# Patient Record
Sex: Female | Born: 1955 | Race: White | Hispanic: No | Marital: Married | Smoking: Current every day smoker
Health system: Southern US, Community
[De-identification: ages and names within clinical notes are randomized; demographics above are authoritative.]

## PROBLEM LIST (undated history)

## (undated) DIAGNOSIS — I714 Abdominal aortic aneurysm, without rupture, unspecified: Secondary | ICD-10-CM

## (undated) DIAGNOSIS — F329 Major depressive disorder, single episode, unspecified: Secondary | ICD-10-CM

## (undated) DIAGNOSIS — Z62819 Personal history of unspecified abuse in childhood: Secondary | ICD-10-CM

## (undated) DIAGNOSIS — I25118 Atherosclerotic heart disease of native coronary artery with other forms of angina pectoris: Secondary | ICD-10-CM

## (undated) DIAGNOSIS — I471 Supraventricular tachycardia, unspecified: Secondary | ICD-10-CM

## (undated) DIAGNOSIS — R4189 Other symptoms and signs involving cognitive functions and awareness: Secondary | ICD-10-CM

## (undated) DIAGNOSIS — T1490XA Injury, unspecified, initial encounter: Secondary | ICD-10-CM

## (undated) DIAGNOSIS — F410 Panic disorder [episodic paroxysmal anxiety] without agoraphobia: Secondary | ICD-10-CM

## (undated) HISTORY — PX: MASTECTOMY PARTIAL / LUMPECTOMY: SUR851

## (undated) HISTORY — DX: Supraventricular tachycardia: I47.1

## (undated) HISTORY — DX: Injury, unspecified, initial encounter: T14.90XA

## (undated) HISTORY — PX: ABLATION: SHX5711

## (undated) HISTORY — DX: Abdominal aortic aneurysm, without rupture, unspecified: I71.40

## (undated) HISTORY — PX: APPENDECTOMY: SHX54

## (undated) HISTORY — DX: Major depressive disorder, single episode, unspecified: F32.9

## (undated) HISTORY — DX: Other symptoms and signs involving cognitive functions and awareness: R41.89

## (undated) HISTORY — DX: Panic disorder (episodic paroxysmal anxiety): F41.0

## (undated) HISTORY — DX: Atherosclerotic heart disease of native coronary artery with other forms of angina pectoris: I25.118

## (undated) HISTORY — DX: Personal history of unspecified abuse in childhood: Z62.819

## (undated) HISTORY — DX: Abdominal aortic aneurysm, without rupture: I71.4

## (undated) HISTORY — PX: OTHER SURGICAL HISTORY: SHX169

## (undated) HISTORY — PX: TOTAL ABDOMINAL HYSTERECTOMY: SHX209

## (undated) HISTORY — DX: Supraventricular tachycardia, unspecified: I47.10

---

## 1998-05-28 ENCOUNTER — Ambulatory Visit (HOSPITAL_BASED_OUTPATIENT_CLINIC_OR_DEPARTMENT_OTHER): Admission: RE | Admit: 1998-05-28 | Discharge: 1998-05-28 | Payer: Self-pay | Admitting: Orthopedic Surgery

## 2001-05-28 ENCOUNTER — Encounter: Payer: Self-pay | Admitting: Obstetrics and Gynecology

## 2001-05-31 ENCOUNTER — Inpatient Hospital Stay (HOSPITAL_COMMUNITY): Admission: RE | Admit: 2001-05-31 | Discharge: 2001-06-02 | Payer: Self-pay | Admitting: Obstetrics and Gynecology

## 2001-05-31 ENCOUNTER — Encounter (INDEPENDENT_AMBULATORY_CARE_PROVIDER_SITE_OTHER): Payer: Self-pay | Admitting: Specialist

## 2003-08-21 ENCOUNTER — Inpatient Hospital Stay (HOSPITAL_COMMUNITY): Admission: EM | Admit: 2003-08-21 | Discharge: 2003-08-29 | Payer: Self-pay | Admitting: Emergency Medicine

## 2004-05-10 ENCOUNTER — Ambulatory Visit: Payer: Self-pay | Admitting: Cardiology

## 2004-09-05 ENCOUNTER — Ambulatory Visit (HOSPITAL_COMMUNITY): Admission: RE | Admit: 2004-09-05 | Discharge: 2004-09-05 | Payer: Self-pay | Admitting: Orthopedic Surgery

## 2004-09-15 ENCOUNTER — Ambulatory Visit (HOSPITAL_COMMUNITY): Admission: RE | Admit: 2004-09-15 | Discharge: 2004-09-16 | Payer: Self-pay | Admitting: Orthopedic Surgery

## 2004-10-03 ENCOUNTER — Ambulatory Visit: Payer: Self-pay | Admitting: Internal Medicine

## 2004-10-03 ENCOUNTER — Inpatient Hospital Stay (HOSPITAL_COMMUNITY): Admission: EM | Admit: 2004-10-03 | Discharge: 2004-10-03 | Payer: Self-pay | Admitting: Emergency Medicine

## 2004-10-06 ENCOUNTER — Ambulatory Visit: Payer: Self-pay | Admitting: Internal Medicine

## 2004-11-03 ENCOUNTER — Ambulatory Visit: Payer: Self-pay | Admitting: Cardiology

## 2004-11-10 ENCOUNTER — Ambulatory Visit: Payer: Self-pay | Admitting: Internal Medicine

## 2004-11-15 ENCOUNTER — Ambulatory Visit: Payer: Self-pay

## 2004-11-16 ENCOUNTER — Ambulatory Visit: Payer: Self-pay

## 2005-01-11 ENCOUNTER — Encounter: Payer: Self-pay | Admitting: Critical Care Medicine

## 2005-01-20 ENCOUNTER — Ambulatory Visit: Payer: Self-pay | Admitting: Cardiology

## 2005-05-09 ENCOUNTER — Ambulatory Visit: Payer: Self-pay | Admitting: Cardiology

## 2005-05-31 ENCOUNTER — Ambulatory Visit: Payer: Self-pay | Admitting: Critical Care Medicine

## 2005-07-13 ENCOUNTER — Ambulatory Visit: Payer: Self-pay | Admitting: Critical Care Medicine

## 2005-10-01 ENCOUNTER — Emergency Department (HOSPITAL_COMMUNITY): Admission: EM | Admit: 2005-10-01 | Discharge: 2005-10-01 | Payer: Self-pay | Admitting: Emergency Medicine

## 2005-10-09 ENCOUNTER — Ambulatory Visit: Payer: Self-pay | Admitting: Internal Medicine

## 2005-10-23 ENCOUNTER — Ambulatory Visit: Payer: Self-pay | Admitting: Internal Medicine

## 2005-10-25 ENCOUNTER — Ambulatory Visit: Payer: Self-pay | Admitting: Internal Medicine

## 2005-10-25 ENCOUNTER — Inpatient Hospital Stay (HOSPITAL_COMMUNITY): Admission: AD | Admit: 2005-10-25 | Discharge: 2005-10-25 | Payer: Self-pay | Admitting: Internal Medicine

## 2005-10-27 ENCOUNTER — Ambulatory Visit: Payer: Self-pay | Admitting: Internal Medicine

## 2005-10-27 ENCOUNTER — Observation Stay (HOSPITAL_COMMUNITY): Admission: AD | Admit: 2005-10-27 | Discharge: 2005-10-28 | Payer: Self-pay | Admitting: Internal Medicine

## 2005-12-05 ENCOUNTER — Ambulatory Visit: Payer: Self-pay | Admitting: Internal Medicine

## 2007-10-09 ENCOUNTER — Ambulatory Visit: Payer: Self-pay | Admitting: Cardiology

## 2007-10-09 ENCOUNTER — Ambulatory Visit: Payer: Self-pay

## 2007-10-18 DIAGNOSIS — G4733 Obstructive sleep apnea (adult) (pediatric): Secondary | ICD-10-CM | POA: Insufficient documentation

## 2007-10-18 DIAGNOSIS — I10 Essential (primary) hypertension: Secondary | ICD-10-CM

## 2007-10-18 DIAGNOSIS — G4730 Sleep apnea, unspecified: Secondary | ICD-10-CM | POA: Insufficient documentation

## 2007-10-18 HISTORY — DX: Essential (primary) hypertension: I10

## 2007-10-18 HISTORY — DX: Obstructive sleep apnea (adult) (pediatric): G47.33

## 2007-10-21 ENCOUNTER — Ambulatory Visit: Payer: Self-pay | Admitting: Critical Care Medicine

## 2007-10-21 ENCOUNTER — Ambulatory Visit: Payer: Self-pay | Admitting: Internal Medicine

## 2007-10-21 DIAGNOSIS — J984 Other disorders of lung: Secondary | ICD-10-CM | POA: Insufficient documentation

## 2007-10-21 LAB — CONVERTED CEMR LAB
Cholesterol: 195 mg/dL (ref 0–200)
HDL: 33.4 mg/dL — ABNORMAL LOW (ref >39.0)
LDL Cholesterol: 129 mg/dL — ABNORMAL HIGH (ref 0–99)
TSH: 1.39 microintl units/mL (ref 0.35–5.50)
Triglycerides: 165 mg/dL — ABNORMAL HIGH (ref 0–149)

## 2007-10-22 ENCOUNTER — Encounter: Payer: Self-pay | Admitting: Critical Care Medicine

## 2007-10-24 ENCOUNTER — Telehealth: Payer: Self-pay | Admitting: Critical Care Medicine

## 2007-11-14 ENCOUNTER — Ambulatory Visit: Payer: Self-pay | Admitting: Internal Medicine

## 2008-11-21 DIAGNOSIS — Z8679 Personal history of other diseases of the circulatory system: Secondary | ICD-10-CM | POA: Insufficient documentation

## 2008-11-21 DIAGNOSIS — E6609 Other obesity due to excess calories: Secondary | ICD-10-CM

## 2008-11-21 DIAGNOSIS — F17200 Nicotine dependence, unspecified, uncomplicated: Secondary | ICD-10-CM | POA: Insufficient documentation

## 2008-11-21 DIAGNOSIS — R011 Cardiac murmur, unspecified: Secondary | ICD-10-CM

## 2008-11-21 DIAGNOSIS — K219 Gastro-esophageal reflux disease without esophagitis: Secondary | ICD-10-CM

## 2008-11-21 DIAGNOSIS — E66811 Obesity, class 1: Secondary | ICD-10-CM

## 2008-11-21 DIAGNOSIS — R519 Headache, unspecified: Secondary | ICD-10-CM | POA: Insufficient documentation

## 2008-11-21 DIAGNOSIS — O009 Unspecified ectopic pregnancy without intrauterine pregnancy: Secondary | ICD-10-CM | POA: Insufficient documentation

## 2008-11-21 DIAGNOSIS — R51 Headache: Secondary | ICD-10-CM | POA: Insufficient documentation

## 2008-11-21 DIAGNOSIS — I2510 Atherosclerotic heart disease of native coronary artery without angina pectoris: Secondary | ICD-10-CM | POA: Insufficient documentation

## 2008-11-21 DIAGNOSIS — I00 Rheumatic fever without heart involvement: Secondary | ICD-10-CM | POA: Insufficient documentation

## 2008-11-21 DIAGNOSIS — I498 Other specified cardiac arrhythmias: Secondary | ICD-10-CM | POA: Insufficient documentation

## 2008-11-21 DIAGNOSIS — E669 Obesity, unspecified: Secondary | ICD-10-CM

## 2008-11-21 DIAGNOSIS — M542 Cervicalgia: Secondary | ICD-10-CM

## 2008-11-21 HISTORY — DX: Gastro-esophageal reflux disease without esophagitis: K21.9

## 2008-11-21 HISTORY — DX: Obesity, class 1: E66.811

## 2008-11-21 HISTORY — DX: Other obesity due to excess calories: E66.09

## 2008-11-21 HISTORY — DX: Cardiac murmur, unspecified: R01.1

## 2008-11-21 HISTORY — DX: Personal history of other diseases of the circulatory system: Z86.79

## 2009-04-29 IMAGING — CT CT CHEST W/O CM
1 of 4 series · 9 of 31 positions shown, 12 images · non-contrast
Comparison: NONE

CLINICAL DATA: Evaluate for pulmonary nodules. 

CT CHEST WITHOUT INTRAVENOUS CONTRAST
TECHNIQUE: Multiple axial images were obtained from the lung 
apex through the upper abdomen.

[Series 2: chest wo · axial · 0.77mm/px · z∈[+887,+1197]mm · 9 of 80 slices shown, 12 images]
[im 9/80  mediastinal]
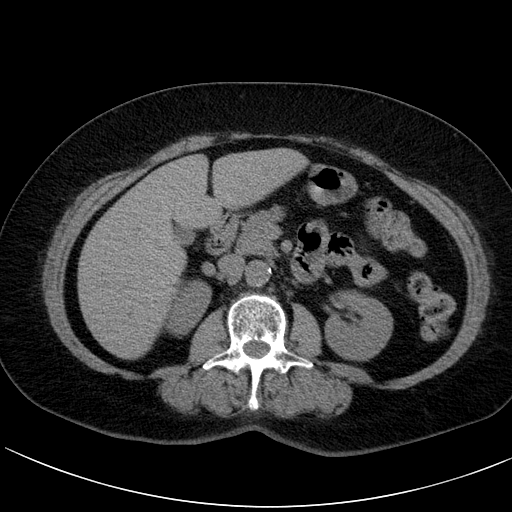
[im 9/80  lung]
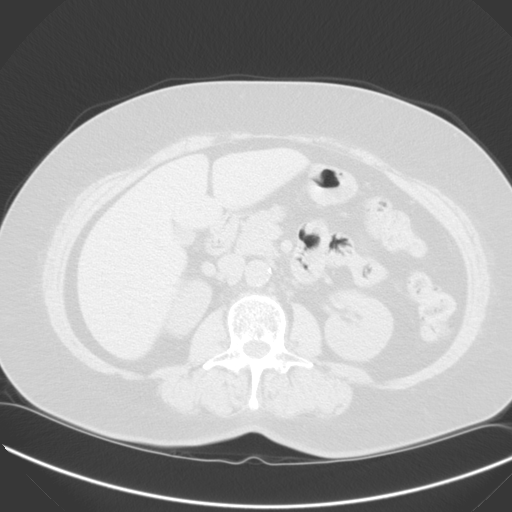
[im 18/80  lung]
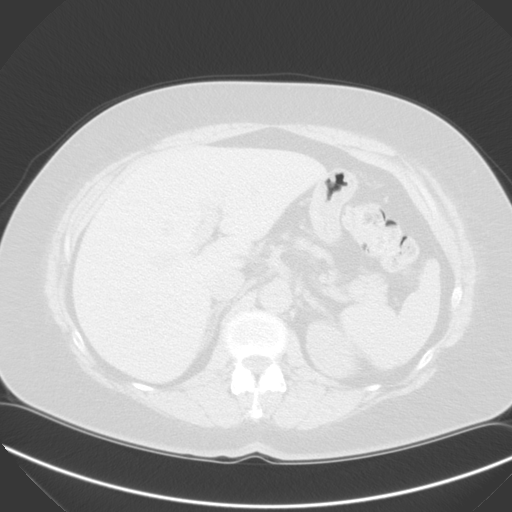
[im 27/80  lung]
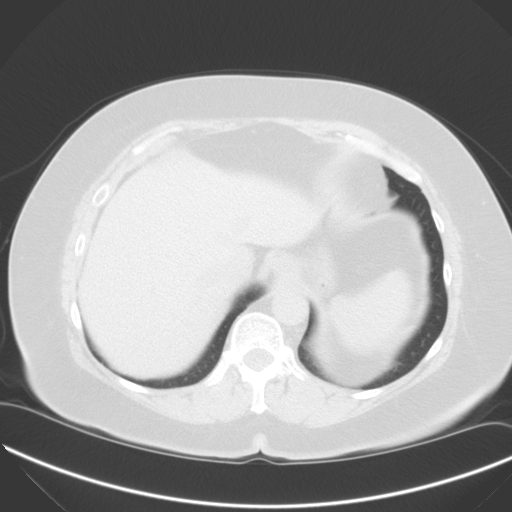
[im 36/80  lung]
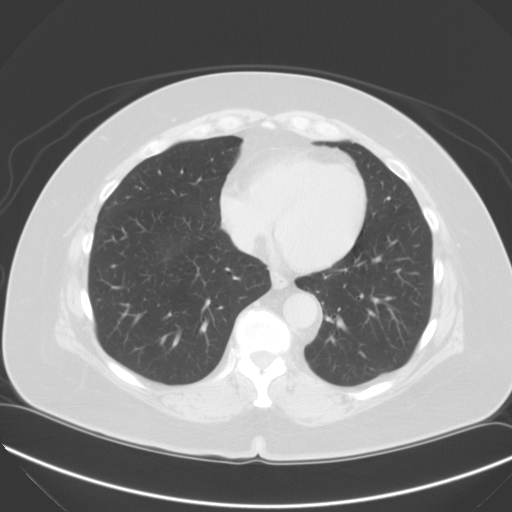
[im 39/80  mediastinal]
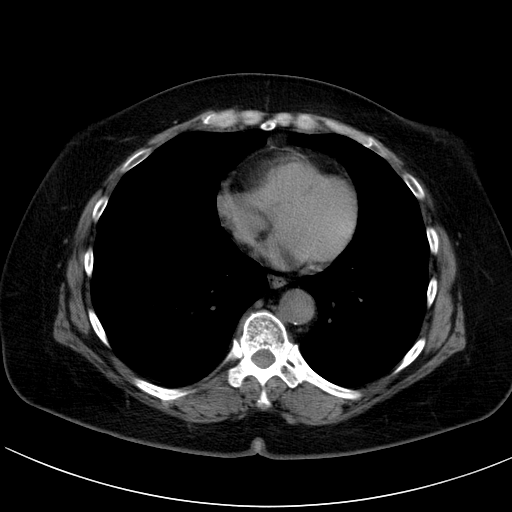
[im 39/80  lung]
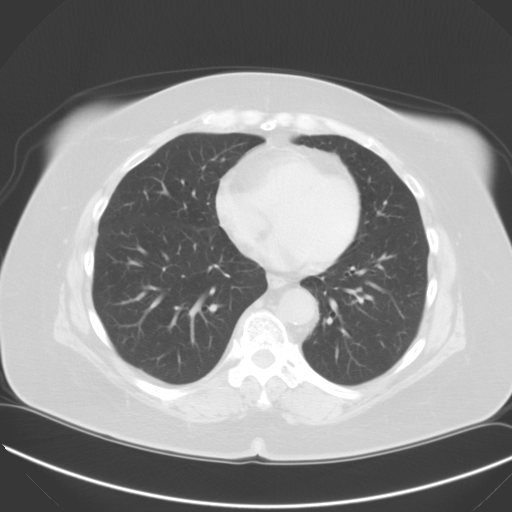
[im 44/80  lung]
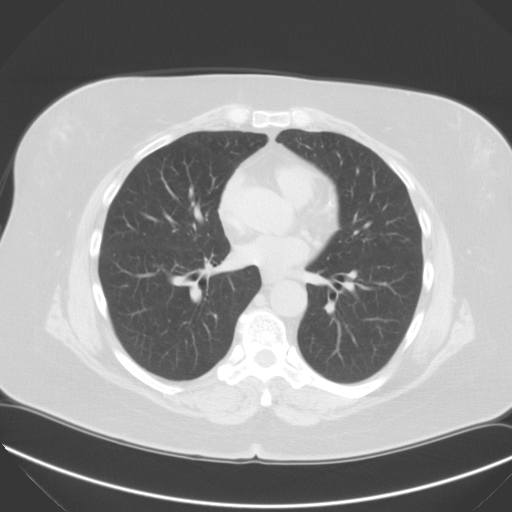
[im 53/80  lung]
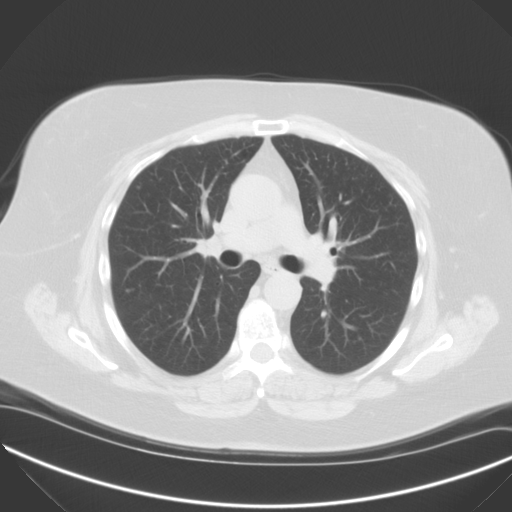
[im 62/80  lung]
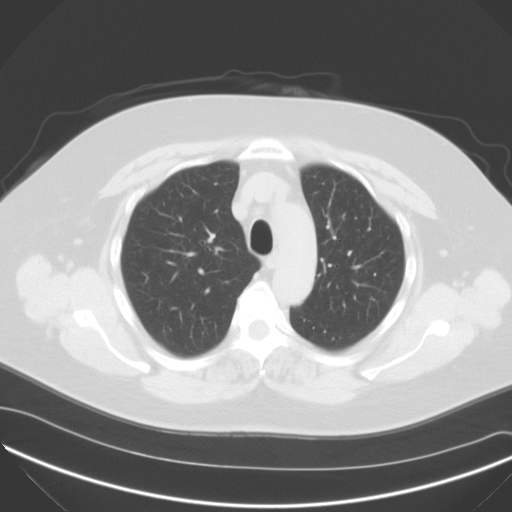
[im 71/80  mediastinal]
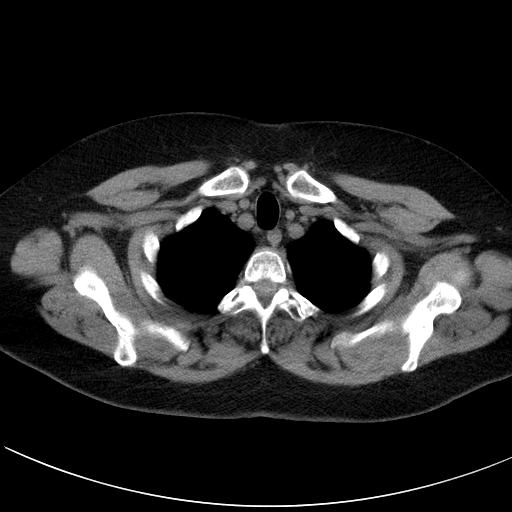
[im 71/80  lung]
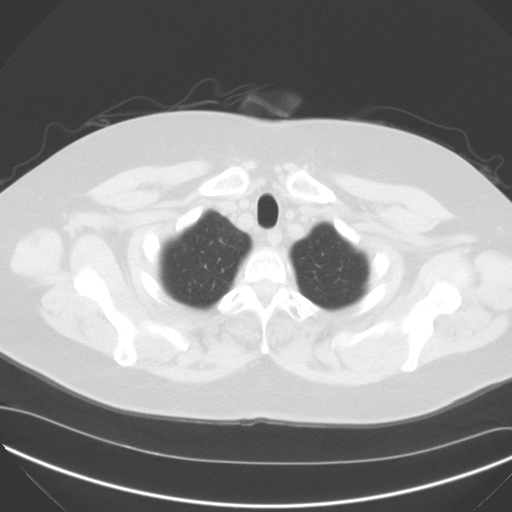

[9 of 31 positions shown; findings below may reference images not displayed]

FINDINGS: No axillary, mediastinal, or hilar mass or adenopathy 
is noted. Visualized portions of the upper abdominal structures 
are unremarkable.  No lung nodule, mass, infiltrate, edema, or 
effusion. No lytic or blastic lesions.
IMPRESSION: Negative uninfused CT of the chest.  No evidence of 
electronically reviewed on 10/23/2007 Dict Date: 10/23/2007  Tran 
Date:  10/23/2007 DAS  JLM

## 2009-05-20 ENCOUNTER — Encounter: Payer: Self-pay | Admitting: Internal Medicine

## 2010-04-14 NOTE — Letter (Signed)
Summary: [name] Progress Note  [facility] Progress [facility]   Imported By: [name] 06/02/2009 13:31:46  _____________________________________________________________________  External Attachment:    Type:   Image     Comment:   External Document

## 2010-07-26 NOTE — Assessment & Plan Note (Signed)
[facility]                            CARDIOLOGY OFFICE NOTE   AYDIA, MAJ                       MRN:          [id number]  DATE:10/09/2007                            DOB:          1955/05/30    PRIMARY CARDIOLOGIST:  Duke Salvia, MD, [facility]    This is a 55 year old white female patient of Dr. Graciela Husbands who has a  history of AV nodal reentrant tachycardia and underwent initial catheter  ablation in 2005.  She subsequently developed recurrent SVT and had a  repeat ablation in 2007 by Dr. Ladona Ridgel.  She did have a cardiac  catheterization back in 2005, which showed calcification of the RCA,  otherwise nonobstructive coronary disease.  Normal LV function.  She has  not been seen in over a year here.  She comes in because of several-  month history of recurrent palpitations.  She is under enormous amount  of stress.  One sister died of lung cancer a few months back and then  her other sister had a heart attack on the day of the funeral and she  has been driving towards [place] to take care of her.  She says the  palpitations usually awaken her at night, but have occurred when she  gets busy or stressed out at work.  They only last a couple of minutes  and ease spontaneously.  They occur about 2 times a week and are not as  severe as before her ablations.  When she saw Dr. Everlene Other she said her  heart rate was up to 140 and he recommended she come here.  The  patient's blood pressure was also up at Dr. Bonney Leitz, and he started her  on Topamax.  She said the palpitations have not changed since she has  been on the Topamax.  Her blood pressure was 153 at that time and that  is the first she has had any elevation.  He did blood work which we are  trying to obtain.  The patient denies any exertional chest pain,  palpitations, dizziness, or presyncope.  She does get dizzy when she has  palpitations at rest, but she has not had any syncope.  She has some  ankle edema.  She said this usually occurs during the summer and she is  very intolerant to the heat.  She continues to smoke several cigarettes  a day.   CURRENT MEDICATIONS:  1. Spiriva daily.  2. Cyclobenzaprine 10 mg nightly.  3. Topamax 50 mg b.i.d.  4. CPAP.   PHYSICAL EXAM:  GENERAL:  This is a pleasant, but anxious 55 year old  white female in no acute distress.  VITAL SIGNS:  Blood pressure 118/79, pulse 98, weight 216.  NECK:  Without JVD, HJR, bruit, or thyroid enlargement.  LUNGS:  Clear anterior, posterior, and lateral.  HEART:  Regular rate and rhythm at 98 beats per minute.  Normal S1 and  S2.  No murmur, rub, bruit, thrill, or heave noted.  ABDOMEN:  Soft without organomegaly, masses, lesions, or abnormal  tenderness.  EXTREMITIES:  Trace of edema in the ankles.  Positive distal pulses.  No  acute change.   EKG, normal sinus rhythm, normal EKG.   IMPRESSION:  1. Recurrent palpitations, rule out recurrent supraventricular      tachycardia.  2. Status post atrioventricular nodal ablation in 2005 with subsequent      atrioventricular repeat ablation in 2007 for recurrent      supraventricular tachycardia.  3. Increased stress.  4. Obesity.  5. Chronic headaches.  6. Hypertension, newly diagnosed.  7. Smoker.   PLAN:  We will check an event recorder on this patient to rule out any  recurrent SVT.  We are trying to obtain what labs Dr. Everlene Other performed  before we do any. I would like a TSH, BMET, and fasting lipid panel if  these were not performed.  She will then need some type of stress test  given her family history of coronary disease and multiple cardiac risk  factors.  We will have her see Dr. Graciela Husbands back in 1 month in followup.      Jacolyn Reedy, PA-C  Electronically Signed      Madolyn Frieze. Jens Som, MD, [facility]  Electronically Signed   ML/MedQ  DD: 10/09/2007  DT: 10/10/2007  Job #: [id number]

## 2010-07-26 NOTE — Assessment & Plan Note (Signed)
[facility]                            CARDIOLOGY OFFICE NOTE   [name], CEPHUS                       MRN:          [id number]  DATE:11/14/2007                            DOB:          [birth year]    [name] is seen at the request of Dr. Jens Som because of  palpitations.  She has a history of AV nodal modification, recurrent  tachycardia, aborted procedure, and subsequently procedure was done  under general anesthesia.  She has had no similar recurrences.  She has  had a huge amount of stress, her sister died in the spring, her other  sister is dying of COPD.  She has been driving back and forth between  here and [place] and is really stressed.  She has had episodes,  where her heart rate is running fast.  There has been a great amount  of accompanying anxiety.  She saw Jacolyn Reedy in the clinic about 6-8  weeks ago and recommended an event recorder.  During this monitoring,  she had no recurrent SVTs.  She had no significant heart rates.   She has also been seen by Pulmonary for her respiratory issues.   At this point, things were stable.   I have told her that there is not really much finding at this time that  really we could do to help we would be glad to.  I encouraged her on  stress management.     Duke Salvia, MD, [facility]  Electronically Signed    SCK/MedQ  DD: 11/14/2007  DT: 11/15/2007  Job #: [id number]

## 2010-07-29 NOTE — Cardiovascular Report (Signed)
[name], [name]                        ACCOUNT NO.:  [id number]   MEDICAL RECORD NO.:  [id number]                   PATIENT TYPE:  INP   LOCATION:  3735                                 FACILITY:  [facility]   PHYSICIAN:  Arturo Morton. Riley Kill, M.D. [facility]         DATE OF BIRTH:  02-24-1956   DATE OF PROCEDURE:  08/24/2003  DATE OF DISCHARGE:                              CARDIAC CATHETERIZATION   INDICATIONS:  The patient has a history of sleep apnea.  She currently  presents with rapid SVT.  Associated with this she had mild elevation of her  enzymes.  CK-MB was 4.2 and troponin 0.9.  As a result, she was brought in  for cardiac catheterization.  Repeat troponins were elevated at 0.32 and  0.19.   PROCEDURES:  1. Left heart catheterization.  2. Selective coronary arteriography.  3. Selective left ventriculography.   DESCRIPTION OF PROCEDURE:  The patient was brought to the catheterization  lab and prepped and draped in the usual fashion.  Through an anterior  puncture, the right femoral artery was easily entered.  A 6 French sheath  was placed.  Views of the left and right coronary arteries were obtained in  multiple angiographic projections.  Ventriculography was performed in the  RAO projection.   She tolerated the procedure well.  She was taken to the holding area in  satisfactory clinical condition.   HEMODYNAMIC DATA:  1. Central aorta:  123/89, mean 105.  2. Left ventricle:  107/12.  3. No gradient on pullback across the aortic valve.   ANGIOGRAPHIC DATA:  1. Left ventriculography in the RAO projection revealed questionable     inferior wall hypokinesis.  Because of ventricular ectopy, this was     really hard to assess.  2. The left main is free of critical disease.  3. The left anterior descending artery demonstrates some very mild     aneurysmal dilatation proximally.  This is just after the ostium.  The     mid vessel has minimal luminal irregularity and provides two  major     diagonal branches.  It appears to be free of critical disease.  4. The circumflex provides two major marginal branches and a smaller     posterior lateral branch.  The circumflex and its branches appear free of     critical disease.  There is perhaps minimal plaquing at the origin of the     first marginal branch.  5. The right coronary artery demonstrates some ostial irregularity.  This is     somewhat hard to gauge.  We attempted to put the catheter in and there     was no evidence of obvious damping at the ostium with the catheter.  The     vessel itself was somewhat ectatic.  The distal vessel consists of     posterior descending and tiny posterior lateral branch.  There is perhaps     30%  narrowing in the distal portion of the vessel.  The ostium is     difficult to grade, but is noted.  There is some reflux of contrast and     absolutely no drop in pressure.   CONCLUSION:  1. Well preserved left ventricular function.  2. Mild irregularities of the right coronary artery as described above with     abnormality involving the ostium.   DISPOSITION:  The current findings do not completely explain the elevated  enzymes.  I am concerned about the ostium of the right coronary artery.  I  will review the options with Dr. Graciela Husbands. Clearly, the patient needs to  discontinue smoking.  Further decisions will be made regarding further  workup after that discussion.                                               Arturo Morton. Riley Kill, M.D. [facility]    TDS/MEDQ  D:  08/24/2003  T:  08/25/2003  Job:  [id number]   cc:   Cheri Rous  [address]  [place]  [place] [address]  Fax: [phone]

## 2010-07-29 NOTE — H&P (Signed)
NAME:  [name], [name]                        ACCOUNT NO.:  [id number]   MEDICAL RECORD NO.:  [id number]                   PATIENT TYPE:  EMS   LOCATION:  MAJO                                 FACILITY:  [facility]   PHYSICIAN:  Learta Codding, M.D. [facility]             DATE OF BIRTH:  November 01, 1955   DATE OF ADMISSION:  08/21/2003  DATE OF DISCHARGE:                                HISTORY & PHYSICAL   REFERRING PHYSICIAN:  Cheri Rous, M.D., [place]   CARDIOLOGIST:  Jaclynn Major, M.D.   REASON FOR ADMISSION:  Sudden onset of rapid supraventricular rhythm  associated with hypotension.   HISTORY OF PRESENT ILLNESS:  The patient is a 55 year old white female  originally from [place], who presents with sudden onset earlier today  of left-sided chest tightness, left arm pain, and dizziness, as well as  associated palpitations.  The patient's history started last night when she  started having some nausea and vomiting and felt weak.  She slept in a  recliner.  She took two aspirins and went to work this morning around 8 a.m.  Around noon, she became very short of breath, weak, and sweaty with the  above-mentioned chest tightness and radiation to the left arm and again with  nausea.  She was then brought to the urgent care center where she was found  to be in rapid supraventricular rhythm.  The patient's blood pressure  reportedly was in the 50s.  They gave the patient Valium and she was  cardioverted.  Subsequently she was transferred to [facility].  Currently the patient is in normal sinus rhythm without any acute EKG  changes and no further chest pain.   ALLERGIES:  CODEINE caused headache and nausea.   MEDICATIONS:  None currently.   PAST MEDICAL HISTORY:  1. History of sleep apnea.  2. History of __________ ectopic pregnancy.  3. History of hysterectomy.  4. History of rheumatic fever with history of cardiac murmur.  5. History of C-section.  6. History of appendectomy.  7. Left total knee replacement and revision.   SOCIAL HISTORY:  The patient lives in [place], [place], with her  husband.  She is a Production designer, theatre/television/film at [employer].  She is married with six children.  One is deceased.  She used to smoke tobacco for 35 years.  She has a prior  history of marijuana and cocaine use, as well as speed 22 years ago.   MEDICATIONS:  The patient uses black cohosh, vitamins E, A, and C, and  multivitamins.   FAMILY HISTORY:  Her mother died at age 1 from myocardial infarction.  Her  father died at 60 of myocardial infarction and hypertension.  She has three  siblings with coronary artery disease, lung cancer, and COPD.   REVIEW OF SYSTEMS:  Notable for sweats and weight gain.  Reports chronic  headaches, chest pain, shortness of  breath, dyspnea on exertion as outlined  above, urgency, dysuria, depression, anxiety, mood disturbance, occasional  nausea and vomiting, and symptoms of GERD.   PHYSICAL EXAMINATION:  VITAL SIGNS:  Blood pressure 106/60, heart rate 98  beats per minute, temperature afebrile.  GENERAL APPEARANCE:  An obese, anxious, white female.  HEENT:  NCAT.  PERRLA.  EOMI.  NECK:  Supple.  Normal carotid upstrokes.  No carotid bruits.  LUNGS:  Clear breath sounds bilaterally.  HEART:  Regular rate and rhythm with normal S1 and S2.  No obvious murmurs.  SKIN:  No rash or lesions.  ABDOMEN:  Soft and nontender.  No rebound or guarding.  GENITOURINARY:  Deferred.  RECTAL:  Deferred.  EXTREMITIES:  No cyanosis, clubbing, or edema.  NEUROLOGIC:  The patient is alert, oriented, and grossly nonfocal.   LABORATORY DATA:  Chest x-ray is pending.  EKG currently in normal sinus  rhythm with no ischemic changes.  Precardioversion EKG demonstrates possible  AVNRT with short RP tachycardia with a rate of 203 beats per minute.   Hemoglobin 14.9, hematocrit 43.4.  Troponins and BMET are pending.   IMPRESSION AND PLAN:  1.  Narrow complex QRS tachycardia.  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  It is interesting that she became quite hypotensive with     this, requiring cardioversion.  There is no evidence of preexcitation on     her baseline tracing.  There is no underlying structural heart disease     and I cannot not hear a murmur that could potentially be consistent with     mitral stenosis.  Will get a 2-D echocardiogram to make sure she does not     have underlying structural abnormalities.  The patient is to be started     on beta blocker for her presumed AV nodal reentry tachycardia.  I     discussed with the patient the possible need for radiofrequency ablation.     I have recommended that this would be the preferred treatment as it could     be quite dangerous to her in the future to be hypotensive if she has     recurrence of this rhythm abnormality.  2. Rule out underlying structural heart disease.  History of cardiac murmur     and rheumatic fever.  Rule out mitral stenosis.  3. Multiple cardiac risk factors and substernal chest pain.  The patient had     typical angina during her rapid rhythm.  She has multiple risk factors.     She also has a prior history of cocaine use.  Will plan to proceed with a     cardiac catheterization on Monday.  I have discussed the risks and     benefits of the procedure with the patient and she is willing to proceed.   DISPOSITION:  The patient will be admitted and ruled out for myocardial  infarction.  I plan on cardiac catheterization in the morning.  Will consult  Dr. Thomasene Lot. Ladona Ridgel for possible radiofrequency ablation which hopefully  can also be done on Monday.                                                Learta Codding, M.D. [facility]    GED/MEDQ  D:  08/21/2003  T:  08/21/2003  Job:  [id number] 

## 2010-07-29 NOTE — Discharge Summary (Signed)
[name], [name]              ACCOUNT NO.:  [id number]   MEDICAL RECORD NO.:  [id number]          PATIENT TYPE:  INP   LOCATION:  3703                         FACILITY:  [facility]   PHYSICIAN:  Maple Mirza, P.A. DATE OF BIRTH:  12-15-1955   DATE OF ADMISSION:  10/02/2004  DATE OF DISCHARGE:  10/03/2004                                 DISCHARGE SUMMARY   DISCHARGE DIAGNOSES:  1.  Chest pain with radiation, tachy-palpitations, and diaphoresis. Heart      rate was 176 on admission. Unfortunately no electrocardiographic      evidence available.  2.  Acute myocardial infarction ruled out by serial cardiac enzymes.      Troponin I was 0.01 and then after 8 hours, 0.01.  3.  D-dimer was 0.92. CT of the chest, however, showed no evidence of      pulmonary embolism.  4.  The patient has maintained sinus rhythm overnight and has had no further      tachy-palpitations.   SECONDARY DIAGNOSES:  1.  Electrophysiology study radio-frequency catheter ablation of AV node on      re-entry tachycardia, August 21, 2003, with slow pathway modification by      Dr. Graciela Husbands.  2.  Left heart catheterization August 24, 2003. The left ventricular function      was preserved. The left main, the LAD, the left circumflex, and the      right coronary artery were free of significant disease. However, the      patient also underwent repeat catheterization on August 27, 2003 with      intravascular ultrasound demonstrating mild non-atherosclerotic      narrowing at the ostium of the right coronary artery. There was some      evidence on the prior catheterization of dynamic obstruction at the      ostium of the right coronary artery and at discharge, recommendation was      made for the patient to take nitroglycerin when she experienced      palpitations and chest tightness.  3.  Recent September 15, 2004 left knee arthroscopy for medial meniscal tear, Dr.      Renae Fickle.  4.  Obstructive sleep apnea/C-PAP.  5.  Chronic neck  pain, status post corticosteroid infections.   PROCEDURE:  No procedures this admission. The patient was ruled out for  acute myocardial infarction on the basis of electrocardiogram and serial  cardiac enzymes and she was ruled out for pulmonary embolism by the basis of  a computer tomogram of the chest.   DISPOSITION:  [name] discharged on hospital day 1 after  admission through the emergency room on October 02, 2004. She has had no  further tachy-palpitations or cardiac dysrhythmias. No respiratory  compromise. She is achieving oxygen saturation 98%. She is afebrile. She has  been see by Dr. Sherryl Manges, who recommended that she undergo outpatient  event monitoring with followup at the office of [facility].   DISCHARGE MEDICATIONS:  1.  Darvocet N-100 1 to 2 tabs every 3 to 4 hours for pain at the  arthroscopic site.  2.  Enteric coated aspirin 325 mg daily.  3.  Verapamil 240 mg daily.  4.  Effexor 75 mg daily.  5.  Xanax 0.5 mg 3 times daily as needed.  6.  Nitroglycerin 0.4 mg 1 tablet under the tongue every 5 minutes as needed      for chest pain.   FOLLOW UP:  1.  With [facility] for event monitor. Our office will call for an      appointment to pick this up.  2.  Followup with clinic at [facility] on Thursday, November 03, 2004      at 2:00.   HISTORY OF PRESENT ILLNESS:  [name] is a 55 year old female. She  has a history of AV nodal re-entry, tachycardia status post ablation in June  of 2005. She also had left heart catheterization x2 at that time. The study  showed that her coronary arteries were essentially free of significant  disease but there was a question of dynamic block at the ostium of the right  coronary artery. The ostium proved to be too large in diameter for stenting.  The patient underwent intravascular ultrasound and discharged on Verapamil  and with instructions to take sublingual nitroglycerin should she  have  repeat of her tachy-palpitations and chest tightness.   The patient was at [employer] yesterday to pickup a present. She developed  palpitations and chest pain about 2:00 in the afternoon. These were of  sudden onset. A blood pressure and pulse were taken. Blood pressure was 111.  She took sublingual nitroglycerin. Her palpitations seemed to intensify.  Blood pressure was okay but her pulse revved up to 176. The patient reports  that she does have episodes of tachy-palpitations in the past. They seem to  be growing in intensity and frequency. This is not often in the past. They  have lasted about 5 minutes before subsiding. The patient usually gets some  relief with sublingual nitroglycerin. She has not been seen by  electrophysiology since her radio-frequency catheter ablation in June of  2005 and her last visit with [facility] was in the fall of 2005. She  will be admitted to rule out  myocardial infarction. Also to rule out  pulmonary embolism.   HOSPITAL COURSE:  The patient presented to [facility] with tachy-  palpitations and chest pain. She was ruled out for myocardial infarction as  well as for pulmonary embolism. She has had no further cardiac  dysrhythmias during her hospitalization. There is no evidence of tachy-  arrhythmia on any telemetry here or admission 12 lead electrocardiogram.  The patient was discharged with medications and followup as dictated.       GM/MEDQ  D:  10/03/2004  T:  10/03/2004  Job:  [id number]   cc:   Cheri Rous  [address]  [place]  [place] [address]  Fax: [phone]   Learta Codding, M.D. [facility]  [address]  [address]  [place]  [place] [address]   Duke Salvia, M.D.   Deidre Ala, M.D.  [address]  [place], [place] [address]  Fax: [phone]

## 2010-07-29 NOTE — Op Note (Signed)
[name], [name]              ACCOUNT NO.:  [id number]   MEDICAL RECORD NO.:  [id number]          PATIENT TYPE:  INP   LOCATION:  2807                         FACILITY:  [facility]   PHYSICIAN:  Doylene Canning. 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   INTRODUCTION:  The patient is a very pleasant 55 year old woman with a  history of recurrent SVT for several years.  She underwent initial catheter  ablation 2 years ago and was fine for several months, and then developed  recurrent tachy palpitations and documented SVT.  The patient underwent  attempted catheter ablation several days ago with the procedure aborted  secondary to her inability to be adequately sedated and concerns about the  development of complete heart block.  The patient returned today for  ablation.   PROCEDURE:  After informed consent was obtained, the patient was taken to  the diagnostic EP lab in the fasting state.  After the usual preparation and  draping, intravenous fentanyl and midazolam was given for sedation.  After  the usual preparation and draping, general anesthesia was utilized for  sedation.  A 6-French hexapolar catheter was inserted percutaneously in the  right jugular vein and advanced to the coronary sinus.  A 5-French  quadripolar catheter was inserted percutaneously in the right femoral vein  and advanced to the right ventricle.  A 5-French quadripolar catheter was  inserted percutaneously in the right femoral vein and advanced to the His  bundle region.  After measurement of the basic intervals, programmed atrial  stimulation was carried out from the coronary sinus at base drive cycle  length of 161 milliseconds and stepwise decreased down to 300 milliseconds  where the AV node ERP was  observed.  During probing stimulation, there were  no AH jumps nor echo beats initially.  Next, rapid atrial pacing was carried  out from the coronary sinus at pacing cycle length of 500 milliseconds and  stepwise decreased down to 360 milliseconds where AV Wenckebach was  observed.  During rapid atrial pacing, the PR interval was less than the RR  interval.  There was no inducible SVT.  Next, rapid ventricular pacing was  carried out from the RV apex at pacing cycle length of 600 milliseconds and  stepwise decreased down to 500 milliseconds where VA Wenckebach was  observed.  During rapid ventricular pacing, the atrial activation sequence  was midline and decremental.  There was no inducible SVT.  Next, programmed  ventricular stimulation was carried out from the RV apex at base drive cycle  length of 096 milliseconds.  The S1, S2 interval was stepwise decreased down  to 480 milliseconds where the retrograde AV node ERP was observed.  During  programmed ventricular stimulation, there were multiple VA jumps and echo  beats but no inducible SVT.  At this point, isoproterenol was administered  at rates between 1 and 4  mcg per minute.  At a rate of 3 mcg per minute,  programmed ventricular stimulation was carried out from the RV apex at an  S1, S2, S3 coupling interval of 500/300/280.  This resulted in the  initiation of SVT.  It should be noted that prior to this, multiple attempts  at inducing SVT were carried out and the SVT was very difficult to induce.  During SVT, the atrial activation was midline and the VA time was  essentially 0.  With a diagnosis of AV node reentrant tachycardia made along  with prior data at previous EP studies demonstrating the same.  A 7-French  quadripolar ablation catheter was inserted percutaneously into the right  femoral vein and advanced into the right atrium.  Mapping was carried out in  Koch's triangle.  Koch's triangle was of normal size and orientation.   Of  note, the atrial electrograms in Koch's triangle were somewhat decreased.  At a site approximately halfway between the CS os in the AV node, RF energy  application was subsequently delivered (Koch's triangle site 4-5).  During  RF energy application, there was prolonged accelerated junctional rhythm.  Following ablation, additional programmed ventricular stimulation, rapid  ventricular pacing, programmed atrial stimulation, and rapid atrial pacing  was then carried out from the coronary sinus and the high right atrium as  well as from the high right ventricle.  There was no inducible arrhythmias.  The patient was observed for approximately 45 minutes and during this time,  she was maintained on high dose isoproterenol and during this time, multiple  pacing attempts were carried out.  However, SVT could not be induced.  Consideration was made as to whether bonus RF energy applications should be  applied but because the successful ablation was very close to the AV node,  it was deemed that any additional RF energy application might result in the  creation of complete heart block.  For this reason, the catheters were  removed, hemostasis was assured and the patient was returned to her room in  satisfactory condition.   COMPLICATIONS:  There were no immediate procedure complications.   RESULTS:  A.  Baseline ECG:  The baseline ECG demonstrates sinus rhythm with  normal axis and intervals.  B.  Baseline intervals of sinus node cycle length 766 milliseconds, QR  restoration was 88 milliseconds, the PR interval was 153 milliseconds.  C.  Rapid ventricular pacing:  Red rapid ventricular pacing was carried out  from the RV apex demonstrating the VA Wenckebach cycle length of 500  milliseconds.  During rapid ventricular pacing, the atrial activation was  midline and decremental.  D.  Programmed ventricular stimulation:  Programmed atrial stimulation was carried out from the RV apex and at  base drive cycle length of 161, 500, and  400 milliseconds.  The S1-S2 interval was stepwise decreased down to 480  milliseconds resulting in the retrograde AV node ERP.  On isoproterenol,  programmed ventricular stimulation resulted in the induction of SVT.  E.  Programmed atrial stimulation:  Programmed atrial stimulation was  carried out from the coronary sinus and high right atrium at base drive  cycle length of 096 and 400 milliseconds.  The S1-S2 interval was stepwise  decreased down to 300 milliseconds resulting in the AV node ERP.  Following  isoproterenol initiation, additional programmed atrial stimulation was  carried out utilizing S1, S2, S3 stimuli and there was no inducible SVT.  F.  Rapid atrial pacing:  Rapid atrial pacing was  carried out from the  coronary sinus and the high right atrium at pacing cycle length of 600  milliseconds and stepwise decreased down to 360 milliseconds where AV  Wenckebach was observed.  During rapid atrial pacing, the PR interval was  less than the RR interval and there was no inducible SVT.  On isoproterenol,  additional rapid atrial pacing was carried out, but this still did not  demonstrate inducible SVT either before or after catheter ablation.  G.  Arrhythmias observed:  AV node reentrant tachycardia initiation was with  programmed ventricular stimulation on isoproterenol, duration was sustained.  Termination was with rapid ventricular pacing.  H.  Mapping:  Mapping of Koch's triangle demonstrated the usual size and  orientation of Koch's triangle.  1. RF energy application:  A single RF energy application was delivered to      site 5 in Koch's triangle resulting in prolonged accelerated junctional      rhythm.  Following ablation there was no inducible arrhythmia.   CONCLUSION:  This study demonstrates successful electrophysiologic study and  RF catheter ablation of AV node reentrant tachycardia with a total of single  RF energy  application delivered to site 5 in Koch's triangle resulting in  accelerated junctional rhythm and rendering the tachycardia noninducible.           ______________________________  Doylene Canning. Ladona Ridgel, MD     GWT/MEDQ  D:  10/27/2005  T:  10/27/2005  Job:  [id number]   cc:   Duke Salvia, MD,FACC

## 2010-07-29 NOTE — Assessment & Plan Note (Signed)
[facility]                           ELECTROPHYSIOLOGY OFFICE NOTE   [name]                       MRN:          [id number]  DATE:12/05/2005                            DOB:          January 17, 1956    DATE OF VISIT:  December 05, 2005.   REASON FOR VISIT:  [name] returns today for followup.  She is a very  pleasant middle-aged woman with a history of recurrent supraventricular  tachycardia.  She underwent initial catheter ablation in 2005.  She  subsequently developed recurrent supraventricular tachycardia and then had a  repeat ablation about a month ago.  She returns today for followup.  She  notes that she was fine until the 1st of September when she started having  problems with nausea and diaphoresis.  One particular episode resulted in a  period of severe dizziness.  She did not have palpitations per say, although  she checked her blood pressure after this episode and her heart rate on the  blood pressure cuff was 130 beats per minute by her report.  The patient has  always had sinus tachycardia, except when she is in supraventricular  tachycardia.  She has had no chest pain.  In the past she has had  catheterization which demonstrated no coronary disease.  She also complains  of headache and is concerned that she needs to have a brain scan.  She  admits to be very inactive and sedentary in her lifestyle and she states  that she would like to lose weight.   PHYSICAL EXAMINATION:  GENERAL APPEARANCE:  She is a pleasant, well-  appearing, middle-aged woman in no acute distress.  VITAL SIGNS:  Blood pressure 131/85, pulse 92 and regular, respirations 18,  weight is 224 pounds.  NECK:  Reveals no jugular venous distention.  LUNGS:  Clear to auscultation bilaterally.  CARDIOVASCULAR:  Regular rate and rhythm with normal S1 and S2.  EXTREMITIES:  Demonstrated no cyanosis, clubbing or edema.   CLINICAL DATA:  EKG demonstrates sinus  rhythm with left axis deviation.   IMPRESSION:  1. Recurrent supraventricular tachycardia, status post ablation.  2. Single isolated episode of nausea and diaphoresis of unclear etiology,      associated with dizziness but no frank syncope, of unknown etiology.  3. Generalized headache.  4. Severe debilitation.  5. Obesity.   DISCUSSION:  With regard to the headache, I have asked the patient to  followup with her primary care physician.  I have discussed the possibility  of wearing a 30 day cardiac monitor as I am still concerned that she could  have recurrent supraventricular tachycardia, although I think it is quite  unlikely.  Most likely, I think she has sinus tachycardia.  I have asked the  patient to start a walking program, either on a treadmill or around her  house, typically trying to walk and  build to up to 30 minutes a day.  She says she will try this.  Will plan to  see her back on a p.r.n. basis.  Doylene Canning. Lindsey Ridgel, MD   GWT/MedQ  DD:  12/05/2005  DT:  12/07/2005  Job #:  [id number]

## 2010-07-29 NOTE — Cardiovascular Report (Signed)
NAMEKATHRYNE, RAMELLA                        ACCOUNT NO.:  [id number]   MEDICAL RECORD NO.:  [id number]                   PATIENT TYPE:  INP   LOCATION:  3735                                 FACILITY:  [facility]   PHYSICIAN:  Arturo Morton. Riley Kill, M.D. [facility]         DATE OF BIRTH:  12-Sep-1955   DATE OF PROCEDURE:  08/27/2003  DATE OF DISCHARGE:                              CARDIAC CATHETERIZATION   INDICATIONS:  Ms. Hussey is a 55 year old female who presented with a rapid  tachycardia requiring cardioversion.  She had hypotension associated with  this.  She had a several month history of discomfort up into the left neck.  The findings were suggestive of angina.  She underwent cardiac  catheterization recently which demonstrated some mild irregularity in the  left coronary system and some mild ostial narrowing in the right coronary  system.  We did not think this was high grade.  She underwent  electrophysiologic study at which time she had ablation done successfully.  During the ablation study, she had recurrent episodes of substernal chest  pain associated with raising the heart rate to greater than 100.  We  questioned whether or not the ostium of the right coronary could be more  involved.  After discussion between Dr. Graciela Husbands and myself, it was our feeling  that the patient should came back for repeat cath and intravascular  ultrasound.  I discussed this with the patient in detail and she was  agreeable to proceed.   PROCEDURES:  1. Selective coronary arteriography of the right coronary artery.  2. Intravascular ultrasound of the right coronary artery.  3. Administration of intracoronary nitroglycerin.   DESCRIPTION OF PROCEDURE:  The patient was brought to the catheterization  lab and prepped and draped in the usual fashion.  Through an anterior  puncture, the left  femoral artery was entered.  Heparin was given at 50  units per kg.  An ACT was checked and was in excess of 200  seconds.  A 6 JR-  4 guiding catheter with side holes was initially utilized and used to engage  the right coronary.  The right coronary artery looked smooth with some  tapering at the ostium of the right coronary as noted before.  We wired this  and then did left lateral views.  With the left lateral views, there  appeared to be a web-like narrowing just beyond the ostium.  This was  thought to be at least moderate and in some views appeared to be up to about  80%.  We initially did intravascular ultrasound throughout the right  coronary artery, but we had some difficulty using the 6 [place] guide to be  able to exit the ostium so that the ostium could be imaged.  Ultimately, the  guide was upgraded to a 7 [place] guide and we were able to successfully IVUS  the ostium.  I then called Dr. Andee Lineman into the room.  Following  intracoronary nitroglycerin, the area that had initially looked like 80  looked more like 30%.  Most importantly, by intravascular ultrasound the  area that was initially about 80 appeared to have luminal area of about 10.9  sq mm.  It had a somewhat oblong shape, but very little evidence of  atherosclerosis at that site.  With the size of the ostium,  and lack of  intravascular plaque it was felt that stenting would be inappropriate.  Dr.  Andee Lineman came into the room and he and I reviewed this carefully and it our  consensus agreement that we avoid stenting in this case.  Moreover, the so  called normal area had an area of about 16-17 mm with dimensions of greater  than 5 mm making drug-eluting technology unusable.  All catheters were  subsequently removed.  I then reviewed the case in detail with the patient's  husband including the intravascular ultrasound studies and angiograms and  discussed with the patient in detail.  It was Dr. Margarita Mail and my opinion  that the patient should be treated with calcium channel blockers and  nitrates, that the nicotine patch be  discontinued.   She was taken to the holding area to let the ACT wear off and with plans for  eventual sheath removal.   ANGIOGRAPHIC DATA:  The right coronary artery demonstrates some tapered  narrowing at the ostium.  In the left lateral view, this appears to be  initially up to about 80%, but after intracoronary nitroglycerin  administered and on the final shots did not appear to be more than about  30%.  The remainder of the vessel demonstrated mild luminal irregularity.   INTRAVASCULAR ULTRASOUND FINDINGS:  Intravascular ultrasound was performed  throughout the vessel.  A second run was done to evaluate the ostium.  Throughout the vessel there was very mild atherosclerotic change, but  without high grade evidence of obstruction throughout the vessel. In the  proximal vessel, there was ectasia resulting in a lumen of greater than 5 mm  in diameter.  At the site of obstruction, there was an oblong area without  much in the way of atherosclerotic change in the vessel with an area of  about 10.9.   CONCLUSION:  Intravascular ultrasound demonstrating mild nonatherosclerotic  narrowing at the previous site with evidence of dynamic obstruction at this  location, but with no evidence of high grade atherosclerotic change.   DISPOSITION:  At the present time, we plan to treat the patient with  initially nitrates and with the addition of calcium channel antagonists as  tolerated.  She will be kept for a minimum of 48 hours.  Discontinuation of  smoking will be important as well as discontinuation of the patient's  nicotine patch.  She will need to be followed closely by Dr. Andee Lineman.                                               Arturo Morton. Riley Kill, M.D. [facility]    TDS/MEDQ  D:  08/27/2003  T:  08/27/2003  Job:  [id number]   cc:   Cheri Rous  [address]  [place]  [place] [address]  Fax: [phone]

## 2010-07-29 NOTE — H&P (Signed)
[facility]  Patient:    [name], [name] Visit Number: [id number] MRN: [id number]          Service Type: GYN Location: 4W 0481 01 Attending Physician:  Lendon Colonel Dictated by:   Kathie Rhodes. Kyra Manges, M.D. Admit Date:  05/31/2001                           History and Physical  CHIEF COMPLAINT:  Pelvic pain.  HISTORY OF PRESENT ILLNESS:  This patient is a 55 year old gravida 5, para 62 female with one spontaneous AB who presented to Urgent Care with low abdominal pain.  At that time her sedimentation rate was normal.  Her white count was normal, hemoglobin was 16.  An ultrasound was performed which revealed a solid mass in her left ovary which was 1.5 cm.  She had a long history of painful periods, and they had gotten better Alesse, but now she states that they are worse.  Because of the continued pelvic pain and the heavy painful periods and the acute episode of pain with the solid mass in the ovary, she is admitted for laparotomy exam under anesthesia.  We had initially thought about doing a laparoscopy because she had a history of a colon polyp, according to the patient, but after talking with Dr. Randa Evens, it appears that all she had was hemorrhoids, and he suggested a barium enema which was done and was essentially within normal limits.  PAST SURGICAL HISTORY: 1. Tubal sterilization. 2. Ectopic. 3. C-section. 4. Appendectomy. 5. Knee replacements. 6. Two D&Cs. 7. Blood transfusion, apparently, with her cesarean section.  ALLERGIES:  She is said to be allergic to CODEINE, and I think MORPHINE makes her nauseated as well.  REVIEW OF SYSTEMS:  HEENT:  She denies headaches or dizziness.  She denies any change in auditory or visual acuity.  HEART:  She is said to have a heart murmur.  It appears to be functional, by her history.  She denies chest pain or shortness of breath.  She denies rheumatic fever.  LUNGS:  No chronic cough.  She  does smoke one-half pack of cigarettes daily.  GENITOURINARY:  She denies stress incontinence.  No frequency.  No recurrent urinary tract infections.  GASTROINTESTINAL:  She denies reflux or heartburn, but she states she has some bright red rectal bleeding from time to time; in fact, had a positive Hemoccult.  Dr. Randa Evens assessed this by colonoscopy in the past and found hemorrhoids.  MUSCLES, BONES, AND JOINTS:  She has had knee replacement.  SOCIAL HISTORY:  She is a Financial planner.  Smokes one-half pack of cigarettes a day.  Does not drink.  FAMILY HISTORY:  Her mother is living at 66.  Her father died at 33.  She has two brothers and three sisters.  One sister has lung cancer.  Maternal aunt has breast cancer.  Her grandmother has colon cancer.  Her mother is mildly hypertensive.  PHYSICAL EXAMINATION:  GENERAL:  Well-developed and nourished female who appears to be her stated age of 74.  Is oriented to time, place, and recent events.  VITAL SIGNS:  Weight 216.  Blood pressure 140/90.  HEENT:  Ears, nose, throat are unremarkable.  Oropharynx is not injected.  NECK:  Supple.  Carotid pulses are equal, without bruits.  Thyroid is normal, and the trachea is in the midline.  BREASTS:  No masses or tenderness.  Axilla negative.  LUNGS:  Clear.  HEART:  Normal sinus rhythm.  I detect no murmurs.  ABDOMEN:  Soft in all planes.  Bowel sounds are normal.  There is a previous midline incision which is well-healed.  I detect no hernia.  No guarding or rebound.  No masses.  Bowel sounds are normal.  No bruits heard.  EXTREMITIES:  Good range of motion.  There is a trace of edema.  PELVIC:  Normal vulva and vagina.  The cervix is without lesion.  Uterus is retroverted, appears to be normal size, with no masses.  IMPRESSION:  Pelvic pain, history of heavy and painful periods which were partially relieved with birth control pills, and history of ultrasound evidence of a small 1.5  cm mass in the left ovary.  PLAN:  Exam under anesthesia and probable laparotomy with TAH/BSO.  Detailed informed consent has been given by the patient, and she has signed papers in the office to reflect that. Dictated by:   S. Kyra Manges, M.D. Attending Physician:  Lendon Colonel DD:  05/31/01 TD:  06/01/01 Job: [id number]

## 2010-07-29 NOTE — Discharge Summary (Signed)
NAMEELEXIS, POLLAK              ACCOUNT NO.:  [id number]   MEDICAL RECORD NO.:  [id number]          PATIENT TYPE:  INP   LOCATION:  4705                         FACILITY:  [facility]   PHYSICIAN:  Doylene Canning. Ladona Ridgel, MD    DATE OF BIRTH:  10/28/1955   DATE OF ADMISSION:  10/27/2005  DATE OF DISCHARGE:  10/28/2005                                 DISCHARGE SUMMARY   The patient has allergies to CODEINE.   This discharge 30 minutes in the making.   PRINCIPAL DIAGNOSES:  1. Atrioventricular nodal reentry tachycardia.      a.     Electrophysiology study, radiofrequency catheter ablation August 21, 2003.  She had recurrence.      b.     Electrophysiology study October 25, 2005, radiofrequency catheter       ablation  aborted secondary to dyspnea and chest pain.  2. Admission October 27, 2005, for third effort at electrophysiology and      study radiofrequency catheter ablation.      a.     Successful radiofrequency catheter ablation of atrioventricular       nodal reentry tachycardia, Dr. Lewayne Bunting.  Successful slow P-wave       ablation, no supraventricular tachycardia.   SECONDARY DIAGNOSES:  1. Left heart catheterization June 2005, no significant coronary artery      disease.  2. Obstructive sleep apnea/CPAP.  3. Chronic neck pain.  4. Gastroesophageal reflux disease.  5. Admitted July 2006 with tachypalpitations and chest pain      a.     Ruled out for myocardial infarction.      b.     Ruled out for pulmonary embolism.      c.     The patient had outpatient event monitor.  6. Myoview study September 2006, ejection fraction of 71%, no ischemia.  7. Left heart catheterization August 24, 2003.  8. Left knee arthroscopy September 15, 2004, for medial meniscus tear, Dr. Renae Fickle.   PROCEDURE:  October 27, 2005, electrophysiology study with successful slow T-  wave ablation of inducible atrioventricular nodal reentry tachycardia, Dr.  Lewayne Bunting.   BRIEF HISTORY:  [name] is a  55 year old female.  She had a  history of reentry tachycardia which is recurrent.  Ablation was performed  August 21, 2003.  Her most recent recurrence was in the middle of July 2007.  Her heart rate was int he 160s.  At that rate it is much lower than her  original rate, which was 200 prior to ablation.  She was seen in the office  by Dr. Sherryl Manges July 30th, and together he and the patient planned  repeat electrophysiology study and radiofrequency catheter ablation.  This  was scheduled electively for October 25, 2005.   However, during the procedure the patient had chest pain and shortness of  breath and radiofrequency catheter ablation was aborted.  The plan at that  time was to return August 17 for the procedure to be done under general  anesthesia.   HOSPITAL COURSE:  The patient  presented electively August 17.  She was  successfully induced under general anesthesia.  Electrophysiology study/  radiofrequency catheter ablation was successfully carried out on Ms. Pospisil  and AV nodal reentry tachycardia was ablated and no SVT in the post ablation  period.  The patient has been asymptomatic after the procedure.  There is no  evidence of hematoma at the right groin.  The right neck is free of  significant swelling or erythema.  Medication regimen has not been changed,  and the patient will be discharged August 18 on her medications, which are  as follows.  Actually, there is one change, one new medication:   1. Enteric-coated aspirin 81 mg daily to be taken for the next 6 weeks to      protect ablation scar from clot.  2. Nexium 40 mg twice daily  3. Spiriva inhaler as before this admission.  4. Xanax as needed.  5. Nitroglycerin 0.4 mg one tablet on the tongue every 5 minutes x3 doses      as needed for chest pain/  6. Note, if the patient and plans dental work, even just teeth cleaning,      through February 2008, she is to call the office in [phone] for      antibiotic  coverage.   She has a follow-up with [facility] at [address] see  Dr. Ladona Ridgel.  Dr. Lubertha Basque office will call with that appointment.   LABORATORY STUDIES COVERING THIS ADMISSION:  Complete blood count:  White  cells 8, hemoglobin 15.7, hematocrit 47.3 and platelets to 236.  The protime  was 11.2, INR 0.8, PTT was 27.9.  Serum electrolytes:  Sodium 149, potassium  4.5, chloride 113, carbonate 29, glucose 94, BUN 14, creatinine 0.9.   This a 30-minute preparation.     ______________________________  Maple Mirza, PA    ______________________________  Doylene Canning. Ladona Ridgel, MD    GM/MEDQ  D:  10/27/2005  T:  10/28/2005  Job:  [id number]   cc:   Duke Salvia, MD,FACC  Learta Codding, MD,FACC  Deidre Ala, M.D.

## 2010-07-29 NOTE — Discharge Summary (Signed)
[facility]  Patient:    [name], [name] Visit Number: [id number] MRN: [id number]          Service Type: GYN Location: 4W [id number] 01 Attending Physician:  Lendon Colonel Dictated by:   Kathie Rhodes. Kyra Manges, M.D. Admit Date:  05/31/2001 Discharge Date: 06/03/2001                             Discharge Summary  ADMISSION DIAGNOSES: 1. Pelvic pain. 2. Solid mass of right ovary.  INDICATIONS FOR ADMISSION:  A 55 year old gravida 5, para 43 female who presented to urgent care with low abdominal pain.  Her sed rate at that time was normal.  White count was normal.  Hemoglobin was 16.  An ultrasound performed revealed a solid mass in the left ovary.  She has a long history of painful periods which had gotten better initially with Alesse but now has become worse.  With the continued pelvic pain, heavy periods, and solid mass although quite small in the ovary, she was admitted for surgery.  She had a history of a colon polyp, but, according to Dr. Randa Evens, she had only hemorrhoids.  Preop barium enema was normal.  She had a history of knee replacement.  LABORATORY DATA:  Studies were not repeated that were done at Urgent Care. Electrolytes were normal.  Potassium was 2.6.  X-ray revealed prominent markings consistent with bronchitis.  Cardiogram was normal.  HOSPITAL COURSE:  The patient was admitted to the hospital and underwent uneventful exam under anesthesia and total hysterectomy with removal of both ovaries.  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  A few adhesions were noted within the abdomen secondary to previous surgical procedures.  Her postoperative course was uncomplicated.  She was covered with antibiotics while she was in the hospital.  Temperature maximum on the last day was 100.1 with an average of 98.8 after that.  She is discharged today having had spontaneous passage of gas, soft abdomen, no significant  vaginal bleeding or discomfort.  DIET:  Regular.  DISCHARGE MEDICATIONS: 1. Dulcolax suppositories as needed. 2. Percocet for pain.  DISCHARGE INSTRUCTIONS:  She is to call for fever, bleeding, or any other difficulties she may encounter.  FOLLOWUP:  She will be seen in the office next Thursday.  CONDITION UPON DISCHARGE:  Improved. Dictated by:   S. Kyra Manges, M.D. Attending Physician:  Lendon Colonel DD:  06/03/01 TD:  06/03/01 Job: [id number]

## 2010-07-29 NOTE — Op Note (Signed)
[name], [name]                        ACCOUNT NO.:  [id number]   MEDICAL RECORD NO.:  [id number]                   PATIENT TYPE:  INP   LOCATION:  3735                                 FACILITY:  [facility]   PHYSICIAN:  Duke Salvia, M.D.               DATE OF BIRTH:  1955-05-21   DATE OF PROCEDURE:  08/21/2003  DATE OF DISCHARGE:                                 OPERATIVE REPORT   PREOPERATIVE DIAGNOSIS:  Supraventricular tachycardia.   POSTOPERATIVE DIAGNOSIS:  AV nodal re-entry tachycardia.   PROCEDURE:  Invasive electrophysiology study, Isoproterenol infusion,  arrhythmia mapping, radiofrequency catheter ablation.   Following obtaining informed consent, the patient was brought to the  electrophysiology laboratory and placed on the fluoroscopic table in the  supine position.  After routine prep and drape, cardiac catheterization was  performed with local anesthesia and conscious sedation.  Noninvasive blood  pressure monitoring and transcutaneous oxygen saturation monitoring and end  tidal CO2 monitoring were performed continuously throughout the procedure.  Following the procedure, the catheters were removed, hemostasis was  obtained, and the patient was transferred to the floor in stable condition.   CATHETERS:  5 French quadripolar catheter was inserted in the left femoral  vein to the high right atrium.  5 French quadripolar catheter was inserted via left femoral vein to the AV  junction to measure His electrogram.  5 French quadripolar catheter was inserted in the left femoral vein to the  right ventricular apex.  6 French octapolar catheter was inserted via the right femoral vein to the  coronary sinus.  7 French 4 mm deflectable tip catheter was inserted in the right femoral  vein to map the sites in the posterior septal space.   Leads 1, AVF, and V1 were monitored continuously throughout the procedure.  Following insertion of the catheters, the stimulation  protocol included  incremental atrial pacing, incremental ventricular pacing, doubler atrial  extra stimuli at paced cycle lengths of 500 and 400 milliseconds from pacing  sites both in the high right atrium and the coronary sinus.   RESULTS:  Service electrocardiogram and basic intervals:  Rhythm is sinus initial and sinus final.  Cycle length is 677 milliseconds initial and 589 milliseconds final.  PR interval is 124 milliseconds initial and 137 milliseconds final.  QRS duration is 84 milliseconds initial and 72 milliseconds final.  QT interval 356 milliseconds initial and 326 milliseconds final.  P wave duration 83 milliseconds initial and 109 milliseconds final.  Bundle branch block is absent.  Pre-excitation is absent.  AH interval 59 milliseconds initial and 72 milliseconds final.  HA interval 57 milliseconds initial and 35 milliseconds final.  His bundle duration 10 milliseconds initial and 10 milliseconds final.  Please note that final measurements were made on isoproterenol at 1 mcg per  minute.   AV nodal function:  AV Wenckebach was 300 milliseconds.  VA Wenckebach in the absence of isoproterenol  was 500 milliseconds.  The AV nodal affect was refractory.  The pathway was defined variably and in  different degrees of exposure to isoproterenol.  AV nodal conduction was  finally demonstrated to be discontinuous and with echo beats an inducible  tachycardia.  Post ablation AV nodal conduction was continuous.   Accessory pathway function:  No evidence of an accessory pathway was  identified.   Arrhythmias induced:  Slow-fast AV nodal re-entry tachycardia was  reproducibly induced with coronary sinus pacing 400, 230, 240, 220.  Cycle  length averaged about 320 milliseconds and was terminated spontaneously.   Characteristics of the tachycardia included:  A.  Initiation dependent upon age prolongation.  B.  AT interval was 39 milliseconds.  C.  ADEQUATE HEMOSTASIS interval  was 254 milliseconds.  D.  VA time was 0 milliseconds.   Radio frequency energy:  A total of 15 seconds of radio frequency energy was  applied with junctional rhythm.  Applications of RF energy were terminated  because of patient discomfort and pain.   IMPRESSION:  1. Normal sinus function.  2. Normal atrial function.  3. Inducible slow/fast AV nodal re-entry tachycardia.  4. Normal His system function.  5. No accessory pathway.  6. Normal ventricular response to programmed stimulation.   SUMMARY AND CONCLUSION:  The results of electrophysiological testing  confirmed slow-fast AV nodal re-entry tachycardia as the mechanism  underlying the patient's clinical arrhythmia.  Slow pathway modification  eliminated the substrate for the patient's tachycardia with absolution of  slow pathway antegrade conduction.   RECOMMENDATIONS:  1. Observe overnight.  2. Endocarditis prophylaxis for six weeks.  3. An aspirin daily for six weeks.                                               Duke Salvia, M.D.   SCK/MEDQ  D:  08/25/2003  T:  08/25/2003  Job:  [id number]   cc:   Cheri Rous  [address]  [place]  [place] [address]  Fax: [phone]

## 2010-07-29 NOTE — Op Note (Signed)
NAMEMATTESON, BLUE               ACCOUNT NO.:  [id number]   MEDICAL RECORD NO.:  [id number]            PATIENT TYPE:   LOCATION:                                 FACILITY:   PHYSICIAN:  Duke Salvia, MD, [facility]   DATE OF BIRTH:   DATE OF PROCEDURE:  DATE OF DISCHARGE:                               OPERATIVE REPORT   PREOPERATIVE DIAGNOSIS:  AV nodal reentry.   POSTOPERATIVE DIAGNOSIS:  AV nodal reentry.   PROCEDURE:  Invasive electrophysiological study with arrhythmia mapping  that was aborted because of chest pain and shortness of breath.   COMPLICATIONS:  As above.   Following the obtaining of informed consent, the patient was brought to  the electrophysiology laboratory and placed on the fluoroscopic table in  a supine position.  After routine prep and drape, cardiac  catheterization was performed with local anesthesia and conscious  sedation.  Noninvasive blood pressure monitoring, transcutaneous oxygen  saturation monitoring and end-tidal CO2 monitoring were performed  continuously throughout the procedure.  Following the procedure the  catheter was removed, hemostasis was obtained, and the patient was  transferred to the floor in stable condition.   CATHETERS:  A 5-French quadripolar catheter was inserted via the left  femoral vein to AV junction to measure the His electrogram.  A 5-French quadripolar catheter was inserted via the left femoral vein  to the right ventricular apex.  A 6-French octapolar catheter was inserted right femoral vein to the  coronary sinus.  A 7French 4-mm deflectable-tip ablation catheter was inserted via the  right femoral vein to mapping sites in the posterior septal space.   Surface leads I, aVF and V1 were monitored continuously throughout the  procedure.  Following insertion of the catheters, the stimulation  protocol included:  Incremental atrial pacing.  Incremental ventricular pacing.  Single atrial extra stimuli at pace cycle  length of 600 msec.  Double atrial extrastimuli at a pace cycle length of 400 msec and double  ventricular extrastimuli at a pace cycle length of 500 msec.   RESULTS:   SURFACE ELECTROCARDIOGRAM AND BASIC INTERVALS:  Initial:  Rhythm is sinus.  RR interval:  824 msec.  PR interval:  139 msec.  QRS duration:  73 msec.  QT interval:  381 msec.  P-wave duration 85 msec.  Bundle branch block:  Absent.  Pre-excitation:  Absent.   AH interval:  54 msec.  HV interval:  64 msec.   Final:  Rhythm:  Sinus.  RR interval:  648 msec.  PR interval:  120 msec.  QRS duration:  67 msec.  QT interval:  387 msec.  P-wave duration:  81 msec.   AH interval:  62 msec.  HV interval:  40 msec.   AV NODAL FUNCTION:  AV Wenckebach was recorded but is not here  available.  The AV nodal effective refractory at a pace cycle of 600 msec is 280  msec, and AV nodal conduction was discontinuous.   ACCESSORY PATHWAY FUNCTION:  No evidence of an accessory pathway was  identified.   Ventricular response to programmed stimulation was  normal for  ventricular stimulation as described previously.   ARRHYTHMIAS INDUCED:  A narrow QRS tachycardia was induced with a cycle  length of 390 msec.  He was induced both with ventricular pacing of  500:300:280, and 400:220:250.  it was terminated with coronary sinus  pacing at 350 msec.   In a typical episode of tachycardia, the time [place] was -50, the HA  interval was 37 msec, , and the AH time, unfortunately, is not recorded  here.   FLUOROSCOPY:  A total of 11 minutes 40 seconds of fluoroscopy was  utilized at 7-1/2 frames per second.   RADIOFREQUENCY ENERGY:  The procedure was terminated because of  uncontrolled chest pain and shortness of breath with tachycardia and  with manipulation.   Otherwise, the patient tolerated the procedure without hemodynamically-  unstabilizing complications.      Duke Salvia, MD, [facility]  Electronically Signed     SCK/MEDQ  D:  06/20/2006  T:  06/20/2006  Job:  [id number]   cc:   Cydney Ok

## 2010-07-29 NOTE — Discharge Summary (Signed)
[name], [name]              ACCOUNT NO.:  [id number]   MEDICAL RECORD NO.:  [id number]          PATIENT TYPE:  INP   LOCATION:  6523                         FACILITY:  [facility]   PHYSICIAN:  Duke Salvia, M.D.  DATE OF BIRTH:  January 23, 1956   DATE OF ADMISSION:  10/25/2005  DATE OF DISCHARGE:  10/25/2005                                 DISCHARGE SUMMARY   ALLERGIES:  TO CODEINE AND ADHESIVE TAPE.   PRINCIPAL DIAGNOSES:  1. Electrophysiology study October 25, 2005, but radio frequency catheter      ablation of AV nodal reentry tachycardia aborted.  (a.)  Recurrent AVNRT.  Patient had emergency room visit mid July of 2007  with heart rate in the 160s.  (b.)  Unable to ablate October 25, 2005 secondary to patient having dyspnea  and chest pain.  1. Prior study for AV nodal reentry tachycardia.  (a.)  Status post electrophysiology study/radio frequency catheter ablation  August 21, 2003.  (b.)  Her reentry tachycardia is symptomatic.  The patient has chest pain,  tachy palpitations and diaphoresis.  1. RESCHEDULE EPS October 27, 2005 UNDER GENERAL ANESTHESIA Dr. Ladona Ridgel.   SECONDARY DIAGNOSES:  1. Left heart catheterization June of 2005.  No significant coronary      artery disease.  2. Obstructive sleep apnea/CPAP.  3. Chronic neck pain.  4. Gastroesophageal reflux disease.  5. Admitted July of 2006 with tachy, palpitations and chest pain.  (a.)  She was ruled out for myocardial infarction.  (b.)  She was ruled out for pulmonary embolism.  (c.)  Patient had outpatient event monitor.  1. Myoview study in September of 2006.  Ejection fraction 71%.  No      ischemia.  2. Left heart catheterization August 24, 2003.  Left ventricular function      preserved.  The left main, the left anterior descending, the left      circumflex and the right coronary artery were free of significant      disease.  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  Recommendation was for the patient to take nitroglycerin when      she experienced palpitations or chest tightness.  3. Left knee arthroscopy on September 15, 2004 for medial meniscal tear, Dr.      Renae Fickle.   PROCEDURE:  October 25, 2005, electrophysiology study with finding of  recurrent AV nodal reentry tachycardia.  The ablation first part of the  procedure was aborted secondary to dyspnea and chest pain.   PLAN:  Patient will discharge October 25, 2005 and reschedule October 27, 2005  10 o'clock in the morning to have EPS RFCA under general anesthesia by Dr.  Ladona Ridgel.   BRIEF HISTORY:  [name] is a 55 year old female.  She had a  history of recurrent AV nodal reentry tachycardia.  She had ablation  performed on August 21, 2003, but she has had recurrences.  Her most recent  recurrence was in the middle of July of 2007.  Her heart rate was in the  160s.  It is much lower than her original tachy palpitations, which were in  the 200 range prior to ablation.  She was seen in the office by Dr. Sherryl Manges, October 09, 2005 and together he and the patient planned repeat  electrophysiology study and radio frequency catheter ablation.  This was  scheduled electively for October 25, 2005.   HOSPITAL COURSE:  The patient presented electively October 25, 2005.  Unfortunately, she was unable to proceed with radio frequency catheter  ablation.  AV nodal reentry tachycardia was once again observed, but the  patient developed some chest pain and shortness of breath.  The patient does  have significant obstructive sleep apnea.  She was discharged the same day,  October 25, 2005, with a return October 27, 2005 for the procedure to be done  under general anesthesia.   DISCHARGE MEDICATIONS:  Include:  1. Nexium 40 mg twice daily.  2. Spiriva inhalations.  3. Xanax as needed.  4.  Nitroglycerin 0.4 under the tongue as needed.   She is asked not to eat anything after midnight Thursday, October 26, 2005  and to return to short stay at [facility] Friday, October 27, 2005  at 10 o'clock.   LABORATORY STUDIES THIS ADMISSION:  Complete blood count:  White cell is  8.0, hemoglobin 15.7, hematocrit 47.3, platelets 236.  PTT was 27.9.  The  ProTime was 11.2, INR 0.8.  Serum electrolytes:  Sodium 149, potassium 4.5,  chloride 113, carbonate 29, glucose 94, BUN is 14, creatinine 0.9.   This is a 30 minute preparation.      Maple Mirza, P.A.    ______________________________  Duke Salvia, M.D.    GM/MEDQ  D:  10/25/2005  T:  10/25/2005  Job:  [id number]   cc:   Learta Codding, MD,FACC  Deidre Ala, M.D.

## 2010-07-29 NOTE — Assessment & Plan Note (Signed)
[facility]                           ELECTROPHYSIOLOGY OFFICE NOTE   RUTHENE, METHVIN                       MRN:          [id number]  DATE:10/09/2005                            DOB:          02/24/1956   [name] is seen at the request of the emergency room because of  recurrent tachypalpitations.   She apparently underwent AV nodal modification in 2005, the report of which  is unavailable.  She has, however, had recurrent SVT since then and  presented to the emergency room last week with recurrent SVT with an R prime  in lead V1.  These episodes are less traumatic than they were and the heart  rates are somewhat lower, in the 160 range instead of in the 200 range.   She has had no problems with chest pain or shortness of breath.  She did  undergo Cardiolite scan a year ago with normal left ventricular function and  no perfusion abnormalities.   She also has other palpitations with PVCs demonstrable by Holter monitoring.   Her other medications include:  1.  Nexium 40 mg b.i.d.  2.  Spiriva one daily.   She is allergic to CODEINE.   PHYSICAL EXAMINATION:  VITAL SIGNS:  On examination today, her blood  pressure is 136/80, her pulse is 100.  LUNGS:  Clear.  CARDIAC:  Heart sounds were regular.  EXTREMITIES:  Without edema.   Electrocardiogram dated October 01, 2005, demonstrated narrow QRS tachycardia  with an R prime in lead V1.  The cycle length was 360 msec.  The EKG  immediately thereafter demonstrated the absence of an R prime in lead V1,  consistent with a retrograde P wave and recurrent AV nodal reentry.   IMPRESSION:  1.  Recurrent tachycardia, probably recurrent AV nodal reentry.  2.  PVCs.   Ms. Dahlem would like to undergo an EP study and RF catheter ablation again.  We have discussed the potential benefits as well as the potential risks.  We  will continue her on her verapamil for the current time and we will stop it  a couple of days prior to the procedure.                                   Duke Salvia, MD, [facility]  SCK/MedQ  DD:  10/09/2005  DT:  10/10/2005  Job #:  [id number]

## 2010-07-29 NOTE — Discharge Summary (Signed)
NAME:  Lindsey, [name]                        ACCOUNT NO.:  [id number]   MEDICAL RECORD NO.:  [id number]                   PATIENT TYPE:  INP   LOCATION:  3735                                 FACILITY:  [facility]   PHYSICIAN:  Learta Codding, M.D. [facility]             DATE OF BIRTH:  1956/01/07   DATE OF ADMISSION:  08/21/2003  DATE OF DISCHARGE:  08/29/2003                                 DISCHARGE SUMMARY   ADMISSION DIAGNOSES:  1. Chest pain, rule out myocardial infarction.  2. Atrial tachycardia at 200 beats per minute.  3. Postmenopausal (surgical).  4. Tobacco use.  5. Remote polysubstance abuse.  6. Sleep apnea.  7. History of rheumatic fever without history of cardiac murmur.   DISCHARGE DIAGNOSES:  1. AV nodal reentry tachycardia, status post radiofrequency ablation.  2. 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  3. Nitrate-induced headache.  4. Ongoing tobacco use.  5. Proteus urinary tract infection--treated.  6. Hypertriglyceridemia with low HDL.   HISTORY OF PRESENT ILLNESS:  [name] is a 55 year old female who  presented to [name] on August 21, 2003 after being evaluated at  an Urgent Care.  Apparently, she had experienced queasiness with nausea and  vomiting, shortness of breath, and diaphoresis.  She felt tight behind her  sternum with radiation to the left arm.  EMS gave the patient oxygen,  Valium, and was found to have a heart rate of 200.  She was electrically  cardioverted.  Returned to sinus rhythm with a controlled rate.   Of note, the patient drinks 1-2 cups of coffee a day and at least a half a  [employer] at work, as well as using NyQuil and smoking cigarettes on a  regular basis.   The patient is admitted for further evaluation and cardiac risk  stratification.   PROCEDURES:  1. Cardiac catheterization August 24, 2003.  2. Radiofrequency ablation of SVT August 25, 2003.  3. Repeat cardiac catheterization August 27, 2003  for recurrent chest pain.   COMPLICATIONS:  None.   CONSULTATIONS:  None.   [name] COURSE:  [name] was admitted to [name] on August 21, 2003 with the aforementioned medical problems.  Cardiac enzymes were  negative.  WBC 10.5, hemoglobin 14.9, and platelets 196.  INR 0.8.  Sodium  139, potassium 4.1, BUN 11, creatinine 1, and glucose 85.  LFT's within  normal limits.  Hemoglobin A1c 5.1%.  TSH 0.931.  Total cholesterol 197,  triglycerides 393, HDL 27, and LDL 73.  UA positive for Proteus mirabilis  greater than 100,000 colonies.   The patient underwent cardiac catheterization on August 24, 2003 because of  presenting symptoms of chest pain and minimally elevated troponin  (subsequently ruled out for an MI as mentioned above).  This revealed  minimal coronary artery disease with a possible 30% ostial RCA with  calcifications and questionable distal 30% RCA  lesion.  LV was normal.  Because of the patient's presentation of tachycardia requiring  cardioversion, Dr. Riley Kill felt it was favorable to proceed with ablation  then assess ischemia in the RCA area.   On August 25, 2003, the patient underwent successful radiofrequency catheter  ablation, slow/fast AVNRT.  The patient did experience chest pain during  tachycardia.  Plans were made for repeat catheterization to IVUS the RCA.   Catheterization revealed 80% narrowing at the origin of the case at the  ostium of the RCA.  After intracoronary nitroglycerin, she had 30% narrowing  at the completion of the case at the ostial RCA.  IVUS performed throughout  showed a tight location of the ostium but no plaque.  This was reviewed with  Dr. Andee Lineman in detail and they recommended proceeding with calcium channel  blocker therapy and nitrate therapy, keep for 48 hours, and watch her on  medications.   The patient had problems with headaches on nitrate therapy and thus Imdur  was discontinued.  The patient will be on verapamil.   Sublingual  nitroglycerin p.r.n. chest pain.   On August 29, 2003, the patient was felt to stable for discharge to home.  Both groins were stable with minimal ecchymoses, no bruits, and distal  pulses intact.  Labs showed a WBC of 6.7, hemoglobin 13.5, and platelets  109.  Potassium 4.3, BUN 17, creatinine 1.   The patient was alert and oriented in no acute distress.   The patient will be discharged to home today.   DISCHARGE MEDICATIONS:  1. Enteric-coated aspirin 325 mg a day.  2. Metoprolol 25 mg t.i.d.  3. Verapamil 120 mg a day.  4. Cipro 250 mg twice a day for five days.  5. Nitroglycerin 0.4 mg 1 under the tongue as needed for chest pain.   ACTIVITY:  No strenuous activity, lifting more than 5 pounds, or driving for  the next two days.  The patient may return to work after this.   DIET:  Low fat/low cholesterol.  She should decreased white products from  diet to help improve her triglycerides.  Additionally, she should stop  smoking for multiple reasons but also to improve her HDL.   She was asked to call the office with any problems or questions.   She was enrolled in the [employer] research trial and a nurse will call  her to arrange her followup.  Follow up with Dr. Andee Lineman in 1-2 weeks.      Georgiann Cocker Jernejcic, P.A.                   Learta Codding, M.D. [name]    TCJ/MEDQ  D:  08/29/2003  T:  08/31/2003  Job:  [id number]   cc:   Cheri Rous  [address]  [place]  [place] [address]  Fax: [phone]

## 2010-07-29 NOTE — H&P (Signed)
NAMEMOE, BRIER              ACCOUNT NO.:  [id number]   MEDICAL RECORD NO.:  [id number]          PATIENT TYPE:  INP   LOCATION:  1830                         FACILITY:  [facility]   PHYSICIAN:  Lorain Childes, M.D. LHCDATE OF BIRTH:  April 19, 1955   DATE OF ADMISSION:  10/02/2004  DATE OF DISCHARGE:                                HISTORY & PHYSICAL   PRIMARY CARDIOLOGIST:  Learta Codding, M.D.   CHIEF COMPLAINT:  Chest pain and palpitations.   HISTORY OF PRESENT ILLNESS:  The patient is a 55 year old female with a  history of AV nodal reentrant tachycardia, status post an ablation in 2005,  brought by EMS to the ER due to chest pain and palpitations.  The patient  reports that she went to [employer] to pick up her prescription.  She  developed palpitations and chest pain around 2 p.m. with sudden onset.  She  took her blood pressure and pulse.  Initially, her heart rate was in the  140s per the reader, and her systolic blood pressure was 111.  She then took  one sublingual nitroglycerin.  The pain persisted and intensified.  She  retook her blood pressure and pulse, and her pulse was up to 176.  Her blood  pressure was stable.  She took an additional sublingual nitroglycerin.  The  pain improved slightly.  Her daughter had the pharmacist check her pulse,  and he reported it via an additional automatic reader, with a pulse of 170s,  and her blood pressure was stable.  He strongly recommended EMS; however,  the patient refused.  She then went to an urgent care center.  There, she  was referred in by EMS to the hospital.  Since then, her chest pain has  resolved, and her pulse on their check initially was 120; however, it  resolved to the 80s in the emergency room.  Unfortunately, no rhythm strips  are available for review.  The patient reports that she has not had  increased caffeine intake.  She denies taking any diet pills or over-the-  counter cold medicines.  She does like to drink  [facility], but she has  not had this over the past couple of weeks.  In addition to her chest pain  and palpitations, she reports shortness of breath which has been worsening  recently.  She has orthopnea of approximately 5 pillows, but it has been  stable for 1 year.  She also sleeps in a recliner intermittently.  She  denies any severe lower extremity edema; however, she does report that her  left lower extremity is slightly larger than her right; however, she thinks  this is due to her recent left knee surgery.   PAST MEDICAL HISTORY:  1.  History of CAD status post cardiac catheterization in 2005 which      revealed the RCA had 30% stenosis by IVUS.  This was felt to be a higher      grade stenosis; however, with intravascular ultrasound, it was revealed      to be only 30%.  She also was noted to have  minor irregularities.  The      left main was normal.  The LAD had a very mild aneurysmal dilatation      proximally, and also minor irregularities.  Her left circumflex had      minor irregularities.  Her EF was noted to be normal.  There was a      question of vasospasm present.  2.  AV node reentrant tachycardia, status post a slow-fast ablation in 2005      after presenting with tachycardia in the 170s, and hypotension, which      required DC cardioversion.  3.  Left knee arthroscopy done approximately 3 weeks ago here at [facility].  4.  Obstructive sleep apnea.  She is not on CPAP currently.  She reports      that her insurance refused to pay.  5.  Status post hysterectomy 3 years ago.  6.  History of appendectomy.  7.  History of anxiety and depression.  8.  Degenerative disk disease, status post cortisone injections in the neck      and the skull for the past 6 weeks.  9.  Obesity.   MEDICATIONS:  1.  Aspirin 325 mg p.o. daily.  2.  Verapamil 240 mg p.o. daily.  3.  Effexor 75 mg p.o. daily.  4.  Nitroglycerin sublingual p.r.n.  5.  Xanax 0.5 mg p.o. t.i.d. p.r.n.    ALLERGIES:  1.  MORPHINE.  2.  CODEINE, which causes headache and nausea.   SOCIAL HISTORY:  She lives in [place] with her husband.  She is a Production designer, theatre/television/film  at [employer].  She has 6 children.  She has a 35 pack year history of  tobacco.  She current smokes approximately less than one-half pack per day.  She denies any alcohol.  No drugs recently.  She did use marijuana, cocaine,  and amphetamines approximately 25 years ago.  No herbal medication use.   FAMILY HISTORY:  Mother died at 71 with an MI.  Father died at 42 with an MI  and hypertension.  She has siblings who have lung cancer, COPD, and coronary  disease.   REVIEW OF SYSTEMS:  She reports chronic neck pain, for which she is supposed  to undergo a nerve block next week.  She reports weight gain.  She denies  any fevers, chills.  She does report diaphoresis with these palpitation and  chest pain episodes.  No skin rashes or lesions.  CARDIOPULMONARY:  She  reports chest pain, shortness of breath, dyspnea on exertion, orthopnea,  PND, palpitations, per the HPI.  She denies any syncope.  She reports  occasional wheezing.  GU:  She has urinary frequency.  No dysuria, no  hematuria.  She does have arthralgias, mostly in her neck, back.  GI:  No  nausea, vomiting, or diarrhea.  No bright red blood per rectum.  No melena.  She has occasional hemorrhoids, but no recent bleeding, and no hematemesis.  No GERD symptoms.  ENDOCRINE:  No polyuria or polydipsia.  All other systems  are negative.   PHYSICAL EXAMINATION:  VITAL SIGNS:  Temperature 98.1, pulse 83,  respirations 22, blood pressure 96/70.  She is 100% on room air.  GENERAL:  She is an anxious, pleasant female, comfortable, in no acute  distress.  HEENT:  Normocephalic and atraumatic.  Oropharynx is clear.  NECK:  Supple.  There are no bruits.  Unable to assess her JVP due to body  habitus.  CARDIOVASCULAR:  She has distant heart sounds __________ split S2.  Regular rate and  rhythm.  Pulses are 2+ throughout.  There are no bruits.  LUNGS:  Clear.  ABDOMEN:  Obese, soft.  Positive bowel sounds throughout.  No organomegaly.  EXTREMITIES:  She has 1+ distal pulses.  There is no frank edema.  Minimal  asymmetry with very, very mild left lower extremity fullness/edema.  NEUROLOGIC:  She is alert and oriented.  Cranial nerves are grossly intact.  Strength is intact.  MUSCULOSKELETAL:  On her left knee, she has mild edema.  There are 2  incision sites which are healing well.  There is no erythema or drainage.   EKG shows a rate of 100, sinus rhythm, right superior axis.  Normal  intervals.  No ischemic changes.   LABORATORY DATA:  Hemoglobin 16.7, hematocrit 49, potassium 4.2, creatinine  0.9.  Her D-dimer was positive at 0.89.   ASSESSMENT AND PLAN:  The patient is a 55 year old female with a history of  AV nodal reentrant tachycardia, status post slow-fast ablation, now with  recurrence of palpitations and chest pain.  1.  Atrioventricular node reentrant tachycardia.  She underwent prior      ablation.  I am concerned about recurrent supraventricular tachycardia.      Will admit her on telemetry.  Will continue her AV nodal blockers, and      will have electrophysiology see her for a possible EP study and      potential therapy.  Will check a thyroid panel, recheck her      echocardiogram, and follow her EKG closely.  Unfortunately, we do not      have any documented arrhythmias yet; however, will watch her closely.      Also of note, her EKG was examined closely, and there is no evidence of      __________ present.  2.  Chest pain.  Her chest pain is likely related to her palpitations.  We      will check cardiac enzymes and follow her EKG.  Her her cardiac enzymes      remain negative, will consider a stress test (i.e. an adenosine      Cardiolite) to asses for ischemia.  Will check a fasting lipid profile.      Will encourage complete tobacco cessation.   Will continue her on      aspirin.  3.  Obstructive sleep apnea.  She needs CPAP.  Her insurance was not      approved by her report previously.  We will restart CPAP during this      hospitalization, and assist with obtaining this as needed.  The      pulmonary hypertension secondary to obstructive sleep apnea can      contribute to atrial arrhythmias, and treating this is of great      importance.  4.  Shortness of breath.  The patient reports shortness of breath that has      worsened over the past couple of weeks.  Her D-dimer is positive.  Will      check a CTP protocol to ensure she does not have a pulmonary embolus, as      she has been relatively immobile due to her recent arthroscopic knee      surgery.        CGF/MEDQ  D:  10/02/2004  T:  10/03/2004  Job:  [id number]

## 2010-07-29 NOTE — Op Note (Signed)
[facility]  Patient:    [name], [name] Visit Number: [id number] MRN: [id number]          Service Type: GYN Location: 4W [id number] 01 Attending Physician:  Lendon Colonel Dictated by:   Kathie Rhodes. Kyra Manges, M.D. Proc. Date: 05/31/01 Admit Date:  05/31/2001                             Operative Report  PREOPERATIVE DIAGNOSIS:  Pelvic pain with solid mass in left ovary.  OPERATION PERFORMED: 1. Exploratory laparotomy. 2. Lysis of adhesions. 3. TAH/BSO  DESCRIPTION OF PROCEDURE:  The patient was placed in lithotomy position, prepped and draped in the usual fashion.  Examination prior to prep and drape revealed a retroverted uterus without masses.  The uterus appeared to be mobile.  She was then prepped and draped for a midline incision with a Foley catheter inserted in the bladder.  A midline incision was made in the abdomen, and hemostasis with the Bovie.  The abdomen was entered, and there are a few small adhesions to the anterior abdominal wall, and these were lysed with sharp dissection.  There were also a few adhesions to the low anterior abdominal wall.  These were lysed.  The bowel integrity appeared to be quite normal.  There was no free fluid within the abdomen.  The uterus was acutely retroverted.  Both ovaries appeared to be normal other than a small, little lesion in the left ovary which I thought represented a fibroma.  The upper abdomen was normal.  The gallbladder appeared to be normal, liver smooth; both kidneys were normal.  The abdominal viscera were then carefully packed away from the pelvic viscera.  The uterus was elevated with Kelly clamps.  Infundibulopelvic ligaments were skeletonized; the ureters were identified.  The infundibulopelvic ligaments were ligated with 0 chromic suture and suture ligated.  Round ligaments were handled with 0 chromic as well.  The uterine arteries were skeletonized and ligated with 0 chromic.   The bladder was pushed off the lower segment.  The cardinals and uterosacral ligaments were carefully clamped and ligated.  Angles of the vagina were entered and the specimens removed from the operative field.  The vagina was then closed in a vertical plane with mattress suture of 0 chromic and 0 Vicryl.  The uterosacral and cardinal complexes were then likewise plicated in the midline with 0 Vicryl.  Reperitonealization with 3-0 Vicryl.  The parietal peritoneum was closed with 2-0 PDS, and the fascia was closed with figure-of-eight sutures of 0 Novofil.  About eight knots were placed in each stitch.  The skin was then closed with 3-0 nylon, and the midline was further closed with clips.  Ms. Shimkus tolerated this procedure well.  No complications.  Estimated blood loss about 200 cc. Dictated by:   S. Kyra Manges, M.D. Attending Physician:  Lendon Colonel DD:  05/31/01 TD:  06/02/01 Job: [id number]

## 2010-07-29 NOTE — Op Note (Signed)
[name], [name]              ACCOUNT NO.:  [id number]   MEDICAL RECORD NO.:  [id number]          PATIENT TYPE:  OIB   LOCATION:  2550                         FACILITY:  [facility]   PHYSICIAN:  Deidre Ala, M.D.    DATE OF BIRTH:  January 27, 1956   DATE OF PROCEDURE:  09/15/2004  DATE OF DISCHARGE:                                 OPERATIVE REPORT   PREOPERATIVE DIAGNOSES:  1.  Left knee medial meniscus tear.  2.  Rule out lateral meniscus tear.  3.  Chondromalacia of patella.  4.  Intact anterior cruciate ligament graft from previous surgery.   POSTOPERATIVE DIAGNOSES:  1.  Displaced flap tear, medial meniscus.  2.  Degenerative inner rim lateral meniscus.  3.  Stretched but still intact anterior cruciate ligament.  4.  Chondromalacia of patella.  5.  Medial and lateral plicas.  6.  Lateral patellar tilt.   OPERATION:  1.  Left knee operative arthroscopy with subtotal left medial meniscectomy.  2.  Trimming of lateral meniscus.  3.  Shrinkage of anterior cruciate ligament graft, thermally.  4.  Arthroscopic lateral retinacular release.  5.  Medial and lateral plica excision.  6.  Abrasion/ablation chondroplasty, posterior patella and medial femoral      condyle.   SURGEON:  1.  Charlesetta Shanks, M.D.   ASSISTANT:  [name], P.A.-C.   ANESTHESIA:  General endotracheal.   CULTURES:  None.   DRAINS:  None.   ESTIMATED BLOOD LOSS:  Minimal.   TOURNIQUET TIME:  38 minutes.   PATHOLOGICAL FINDINGS AND HISTORY:  [name] is a 55 year old female somewhat  overweight who had about 10 years ago ACL reconstruction.  She twisted her  knee about a month ago at a store and sustained a catching/locking sensation  that happened several times.  [name], evaluated her, she had also hurt  her neck and shoulder, we evaluated her and sent her for an MRI scan which  showed a medial meniscus tear and intact ACL graft with some question of a  lateral meniscus tear.  Because of her  severe discomfort in her knee, we  elected to proceed with diagnostic and operative arthroscopy.  She did not  have an overtly positive Lachman test.  At surgery under anesthesia, her  Lachman came forward about 3-4 mm with an end point, she did not have a  pivot shift.  On exam of her knee with the scope, there was a huge flap tear  of the medial meniscus flipped up along the medial joint line that then  displaced into the joint very freely with a degenerative tear that was  fairly well shredded all the way back to the far posterior horn.  We  basically debrided this completely to a stable rim.  There were some minor  degenerative changes in the posteromedial corner along the medial femoral  condyle and there was a large medial plica that was debrided.  There was  inferior pole chondromalacia of the patella with a tight lateral  retinaculum.  The trochlea looked good.  The lateral meniscus had  degenerative inner rim  fraying which we debrided and smoothed with the  ablator.  The ACL was intact although somewhat unhealthy-looking and  stretched, it did have an end point under direct vision and we were able to  thermally shrink it with the ablator on 1 and it actually looked quite  normal with less excursion on anterior draw under direct vision.  The  lateral plica was also excised.   DESCRIPTION OF PROCEDURE:  With adequate anesthesia obtained using  endotracheal technique and 1 g of Ancef IV for prophylaxis, the patient was  placed in the supine position and the left lower extremity was prepped from  the malleoli to the tourniquet in the standard fashion.  After standard  prepping and draping, Esmarch exsanguination was used and the tourniquet was  inflated up to 350 mmHg.  A superolateral inflow portal was made, the knee  was insufflated with normal saline with the arthroscopic pump.  Medial and  lateral scope portals were then made and the joint was thoroughly inspected.  I then shaved  out the medial plica back to the sidewall along the medial  band.  I then noted the meniscus flipped up into the medial joint line,  reduced it back, checked its configuration and then brought in the shaver  and shaved the meniscus back to a stable rim from the flap which was in mid  substance.  I then used the basket to further saucerize the posterior horn  and shaved it to a smooth surface with the ablator on 1 to smooth.  Some  ablation was used on the medial femoral condyle dime sized defect and on the  medial tibial plateau.  I then checked the ACL, did the shrinkage maneuver  as above with the ablator on 1 and I then checked the lateral meniscus,  probed it, and used the basket and shaver to saucerize the inner rim.  I  then shaved out the lateral plica.  I then used the shaver and ablator on 1  to smooth the chondromalacia of patella.  I then did an arthroscopic lateral  retinacular release on the vastus lateralis of the joint line using the  ArthroCare hook.  Improvement was seen and felt intact.  The knee was then  irrigated with the scope with 0.5% Marcaine injected in and about the  portals.  The portals were left open.  A bulky soft compression dressing was  applied with lateral foam pad for tamponade and Easy wrap placed.  The  patient then having tolerated the procedure well was awakened and taken to  the recovery room to assess whether or not her sleep apnea posed any  problems, whether she should spend the night or not, and she was given  Percocet for pain and told to call the hospital for recheck tomorrow.       VEP/MEDQ  D:  09/15/2004  T:  09/15/2004  Job:  [id number]

## 2015-03-22 DIAGNOSIS — G43009 Migraine without aura, not intractable, without status migrainosus: Secondary | ICD-10-CM

## 2015-03-22 HISTORY — DX: Migraine without aura, not intractable, without status migrainosus: G43.009

## 2015-04-14 DIAGNOSIS — Z86010 Personal history of colonic polyps: Secondary | ICD-10-CM | POA: Insufficient documentation

## 2015-04-14 DIAGNOSIS — Z860101 Personal history of adenomatous and serrated colon polyps: Secondary | ICD-10-CM

## 2015-04-14 HISTORY — DX: Personal history of adenomatous and serrated colon polyps: Z86.0101

## 2016-08-11 DIAGNOSIS — Z85828 Personal history of other malignant neoplasm of skin: Secondary | ICD-10-CM | POA: Insufficient documentation

## 2016-08-11 DIAGNOSIS — Z853 Personal history of malignant neoplasm of breast: Secondary | ICD-10-CM | POA: Insufficient documentation

## 2016-08-11 DIAGNOSIS — K068 Other specified disorders of gingiva and edentulous alveolar ridge: Secondary | ICD-10-CM | POA: Insufficient documentation

## 2016-08-11 HISTORY — DX: Personal history of other malignant neoplasm of skin: Z85.828

## 2016-08-18 DIAGNOSIS — E782 Mixed hyperlipidemia: Secondary | ICD-10-CM

## 2016-08-18 HISTORY — DX: Mixed hyperlipidemia: E78.2

## 2016-09-15 DIAGNOSIS — D242 Benign neoplasm of left breast: Secondary | ICD-10-CM | POA: Insufficient documentation

## 2016-09-15 HISTORY — DX: Benign neoplasm of left breast: D24.2

## 2017-03-13 DIAGNOSIS — S069XAA Unspecified intracranial injury with loss of consciousness status unknown, initial encounter: Secondary | ICD-10-CM | POA: Insufficient documentation

## 2017-03-13 HISTORY — DX: Unspecified intracranial injury with loss of consciousness status unknown, initial encounter: S06.9XAA

## 2017-08-28 DIAGNOSIS — S2241XA Multiple fractures of ribs, right side, initial encounter for closed fracture: Secondary | ICD-10-CM | POA: Insufficient documentation

## 2017-08-28 DIAGNOSIS — S22019A Unspecified fracture of first thoracic vertebra, initial encounter for closed fracture: Secondary | ICD-10-CM

## 2017-08-28 DIAGNOSIS — S065X9A Traumatic subdural hemorrhage with loss of consciousness of unspecified duration, initial encounter: Secondary | ICD-10-CM | POA: Insufficient documentation

## 2017-08-28 DIAGNOSIS — S065XAA Traumatic subdural hemorrhage with loss of consciousness status unknown, initial encounter: Secondary | ICD-10-CM

## 2017-08-28 DIAGNOSIS — S82001A Unspecified fracture of right patella, initial encounter for closed fracture: Secondary | ICD-10-CM

## 2017-08-28 DIAGNOSIS — S82891B Other fracture of right lower leg, initial encounter for open fracture type I or II: Secondary | ICD-10-CM | POA: Insufficient documentation

## 2017-08-28 DIAGNOSIS — I609 Nontraumatic subarachnoid hemorrhage, unspecified: Secondary | ICD-10-CM | POA: Insufficient documentation

## 2017-08-28 DIAGNOSIS — T1490XA Injury, unspecified, initial encounter: Secondary | ICD-10-CM | POA: Insufficient documentation

## 2017-08-28 HISTORY — DX: Unspecified fracture of right patella, initial encounter for closed fracture: S82.001A

## 2017-08-28 HISTORY — DX: Traumatic subdural hemorrhage with loss of consciousness status unknown, initial encounter: S06.5XAA

## 2017-08-28 HISTORY — DX: Other fracture of right lower leg, initial encounter for open fracture type I or II: S82.891B

## 2017-08-28 HISTORY — DX: Nontraumatic subarachnoid hemorrhage, unspecified: I60.9

## 2017-08-28 HISTORY — DX: Unspecified fracture of first thoracic vertebra, initial encounter for closed fracture: S22.019A

## 2017-08-28 HISTORY — DX: Multiple fractures of ribs, right side, initial encounter for closed fracture: S22.41XA

## 2017-09-21 DIAGNOSIS — S62318A Displaced fracture of base of other metacarpal bone, initial encounter for closed fracture: Secondary | ICD-10-CM | POA: Insufficient documentation

## 2017-09-21 HISTORY — DX: Displaced fracture of base of other metacarpal bone, initial encounter for closed fracture: S62.318A

## 2017-10-05 DIAGNOSIS — R413 Other amnesia: Secondary | ICD-10-CM | POA: Insufficient documentation

## 2017-11-23 HISTORY — DX: Hypercalcemia: E83.52

## 2018-12-24 DIAGNOSIS — R262 Difficulty in walking, not elsewhere classified: Secondary | ICD-10-CM | POA: Insufficient documentation

## 2018-12-24 DIAGNOSIS — R202 Paresthesia of skin: Secondary | ICD-10-CM

## 2018-12-24 DIAGNOSIS — Z9989 Dependence on other enabling machines and devices: Secondary | ICD-10-CM | POA: Insufficient documentation

## 2018-12-24 HISTORY — DX: Paresthesia of skin: R20.2

## 2020-09-14 DIAGNOSIS — Z87898 Personal history of other specified conditions: Secondary | ICD-10-CM

## 2020-09-14 DIAGNOSIS — Z9181 History of falling: Secondary | ICD-10-CM | POA: Insufficient documentation

## 2020-09-14 HISTORY — DX: Personal history of other specified conditions: Z87.898

## 2020-09-17 ENCOUNTER — Telehealth: Payer: Self-pay

## 2020-09-17 NOTE — Telephone Encounter (Signed)
NOTES ON [name], REFERRAL SENT TO SCHEDULING

## 2020-10-08 DIAGNOSIS — I714 Abdominal aortic aneurysm, without rupture, unspecified: Secondary | ICD-10-CM | POA: Insufficient documentation

## 2020-10-08 HISTORY — DX: Abdominal aortic aneurysm, without rupture, unspecified: I71.40

## 2020-10-27 ENCOUNTER — Other Ambulatory Visit: Payer: Self-pay

## 2020-10-27 ENCOUNTER — Encounter: Payer: Self-pay | Admitting: Cardiology

## 2020-10-27 ENCOUNTER — Ambulatory Visit (INDEPENDENT_AMBULATORY_CARE_PROVIDER_SITE_OTHER): Payer: Medicare Other | Admitting: Cardiology

## 2020-10-27 VITALS — BP 102/70 | HR 80 | Ht 67.0 in | Wt 177.0 lb

## 2020-10-27 DIAGNOSIS — R079 Chest pain, unspecified: Secondary | ICD-10-CM | POA: Diagnosis not present

## 2020-10-27 DIAGNOSIS — Z720 Tobacco use: Secondary | ICD-10-CM | POA: Diagnosis not present

## 2020-10-27 DIAGNOSIS — I6523 Occlusion and stenosis of bilateral carotid arteries: Secondary | ICD-10-CM

## 2020-10-27 DIAGNOSIS — I493 Ventricular premature depolarization: Secondary | ICD-10-CM | POA: Diagnosis not present

## 2020-10-27 DIAGNOSIS — I714 Abdominal aortic aneurysm, without rupture, unspecified: Secondary | ICD-10-CM

## 2020-10-27 DIAGNOSIS — R002 Palpitations: Secondary | ICD-10-CM

## 2020-10-27 DIAGNOSIS — R0600 Dyspnea, unspecified: Secondary | ICD-10-CM

## 2020-10-27 DIAGNOSIS — E782 Mixed hyperlipidemia: Secondary | ICD-10-CM

## 2020-10-27 DIAGNOSIS — R0609 Other forms of dyspnea: Secondary | ICD-10-CM

## 2020-10-27 MED ORDER — NITROGLYCERIN 0.4 MG SL SUBL: 0.4000 mg | SUBLINGUAL_TABLET | SUBLINGUAL | 3 refills | Status: DC | PRN

## 2020-10-27 NOTE — Patient Instructions (Signed)
Medication Instructions:  Your physician has recommended you make the following change in your medication:  START: Nitroglycerin 0.4 mg take one tablet by mouth every 5 minutes up to three times as needed for chest pain.  *If you need a refill on your cardiac medications before your next appointment, please call your pharmacy*   Lab Work: Your physician recommends that you return for lab work in:  TODAY: BMET, [place], CBC If you have labs (blood work) drawn today and your tests are completely normal, you will receive your results only by: MyChart Message (if you have MyChart) OR A paper copy in the mail If you have any lab test that is abnormal or we need to change your treatment, we will call you to review the results.   Testing/Procedures:  [place] [id number] Dept: [phone] Loc: [place]  10/27/2020  You are scheduled for a Cardiac Catheterization on Monday, August 22 with Dr. Lauree Chandler.  1. Please arrive at the [facility] (Main Entrance A) at [facility]: [address] [place], [place] [address] at 5:30 AM (This time is two hours before your procedure to ensure your preparation). Free valet parking service is available.   Special note: Every effort is made to have your procedure done on time. Please understand that emergencies sometimes delay scheduled procedures.  2. Diet: Do not eat solid foods after midnight.  The patient may have clear liquids until 5am upon the day of the procedure.  3. Labs: You will need to have blood drawn on TODAY.  4. Medication instructions in preparation for your procedure:   Contrast Allergy: No  On the morning of your procedure, take your Aspirin and any morning medicines NOT listed above.  You may use sips of water.  5. Plan for one night stay--bring personal belongings. 6. Bring a current  list of your medications and current insurance cards. 7. You MUST have a responsible person to drive you home. 8. Someone MUST be with you the first 24 hours after you arrive home or your discharge will be delayed. 9. Please wear clothes that are easy to get on and off and wear slip-on shoes.  Thank you for allowing [place] to care for you!   -- [facility]  A zio monitor was ordered today. It will remain on for 14 days. You will then return monitor and event diary in provided box. It takes 1-2 weeks for report to be downloaded and returned to [place]. We will call you with the results. If monitor falls off or has orange flashing light, please call Zio for further instructions.   Your physician has requested that you have an echocardiogram. Echocardiography is a painless test that uses sound waves to create images of your heart. It provides your doctor with information about the size and shape of your heart and how well your heart's chambers and valves are working. This procedure takes approximately one hour. There are no restrictions for this procedure.   Follow-Up: At [facility], you and your health needs are our priority.  As part of our continuing mission to provide you with exceptional heart care, we have created designated Provider Care Teams.  These Care Teams include your primary Cardiologist (physician) and Advanced Practice Providers (APPs -  Physician Assistants and Nurse Practitioners) who all work together to provide you with the care you need, when you  need it.  We recommend signing up for the patient portal called "MyChart".  Sign up information is provided on this After Visit Summary.  MyChart is used to connect with patients for Virtual Visits (Telemedicine).  Patients are able to view lab/test results, encounter notes, upcoming appointments, etc.  Non-urgent messages can be sent to your provider as well.   To learn more about what you can do with MyChart,  go to [email or web address].    Your next appointment:   2 weeks  The format for your next appointment:   In Person      Other Instructions Echocardiogram An echocardiogram is a test that uses sound waves (ultrasound) to produce images of the heart. Images from an echocardiogram can provide important information about: Heart size and shape. The size and thickness and movement of your heart's walls. Heart muscle function and strength. Heart valve function or if you have stenosis. Stenosis is when the heart valves are too narrow. If blood is flowing backward through the heart valves (regurgitation). A tumor or infectious growth around the heart valves. Areas of heart muscle that are not working well because of poor blood flow or injury from a heart attack. Aneurysm detection. An aneurysm is a weak or damaged part of an artery wall. The wall bulges out from the normal force of blood pumping through the body. Tell a health care provider about: Any allergies you have. All medicines you are taking, including vitamins, herbs, eye drops, creams, and over-the-counter medicines. Any blood disorders you have. Any surgeries you have had. Any medical conditions you have. Whether you are pregnant or may be pregnant. What are the risks? Generally, this is a safe test. However, problems may occur, including an allergic reaction to dye (contrast) that may be used during the test. What happens before the test? No specific preparation is needed. You may eat and drink normally. What happens during the test?  You will take off your clothes from the waist up and put on a hospital gown. Electrodes or electrocardiogram (ECG)patches may be placed on your chest. The electrodes or patches are then connected to a device that monitors your heart rate and rhythm. You will lie down on a table for an ultrasound exam. A gel will be applied to your chest to help sound waves pass through your skin. A handheld  device, called a transducer, will be pressed against your chest and moved over your heart. The transducer produces sound waves that travel to your heart and bounce back (or "echo" back) to the transducer. These sound waves will be captured in real-time and changed into images of your heart that can be viewed on a video monitor. The images will be recorded on a computer and reviewed by your health care provider. You may be asked to change positions or hold your breath for a short time. This makes it easier to get different views or better views of your heart. In some cases, you may receive contrast through an IV in one of your veins. This can improve the quality of the pictures from your heart. The procedure may vary among health care providers and hospitals. What can I expect after the test? You may return to your normal, everyday life, including diet, activities, andmedicines, unless your health care provider tells you not to do that. Follow these instructions at home: It is up to you to get the results of your test. Ask your health care provider, or the department that is doing the test,  when your results will be ready. Keep all follow-up visits. This is important. Summary An echocardiogram is a test that uses sound waves (ultrasound) to produce images of the heart. Images from an echocardiogram can provide important information about the size and shape of your heart, heart muscle function, heart valve function, and other possible heart problems. You do not need to do anything to prepare before this test. You may eat and drink normally. After the echocardiogram is completed, you may return to your normal, everyday life, unless your health care provider tells you not to do that. This information is not intended to replace advice given to you by your health care provider. Make sure you discuss any questions you have with your healthcare provider. Document Revised: 10/21/2019 Document Reviewed:  10/21/2019 Elsevier Patient Education  2022 [employer].

## 2020-10-27 NOTE — H&P (View-Only) (Signed)
Cardiology Office Note:    Date:  10/27/2020   ID:  [name], [name] [name], MRN [id number]  PCP:  Lindsey Fuchs, PA  Cardiologist:  Lindsey Salines, DO  Electrophysiologist:  None   Referring MD: Lindsey Fuchs, PA   " I am having chest pain"  History of Present Illness:    [name] is a 65 y.o. female with a hx of SVT status post ablation and followed [name] for many years and last saw him in 2008, hyperlipidemia recently started on atorvastatin, history of left and right breast cancer with radiation and lumpectomy, current smoker, family history of premature coronary artery disease, recently diagnosed abdominal aortic aneurysm and bilateral carotid atherosclerosis.  The patient is here today with multiple complaints.  She tells me that her most pressing complaint is the fact that she has been experiencing progressive chest pain.  At first she said it was occurring on exertion.  She notes that recently this pain has been waking her up at night and also happens when she is seated.  She describes it as a midsternal chest pressure-like sensation.  She notes that she has radiation of this pain once is started to her left arm and also to her jaw.  It [name] on and lasts for 5 to 10 minutes.  Nothing makes it worse.  She is really concerned because the pain is absolutely different from the pain she experienced when she had her SVT.  At that time she had chest tightness.  She also reports that she is experiencing palpitations which is abrupt onset of fast heartbeat which [name] along with dizziness and shortness of breath.  Past Medical History:  Diagnosis Date   AAA (abdominal aortic aneurysm) (HCC)    Cognitive change    SVT (supraventricular tachycardia) (HCC)     Past Surgical History:  Procedure Laterality Date   ABLATION     3times   APPENDECTOMY     MASTECTOMY PARTIAL / LUMPECTOMY Left    Multiple fx repaired     TOTAL ABDOMINAL HYSTERECTOMY      Current  Medications: Current Meds  Medication Sig   atorvastatin (LIPITOR) 20 MG tablet Take 20 mg by mouth daily.   nitroGLYCERIN (NITROSTAT) 0.4 MG SL tablet Place 1 tablet (0.4 mg total) under the tongue every 5 (five) minutes as needed for chest pain.   topiramate (TOPAMAX) 100 MG tablet Take 200 mg by mouth 2 (two) times daily.     Allergies:   Latex, Other, Codeine, and Tape   Social History   Socioeconomic History   Marital status: Married    Spouse name: Not on file   Number of children: Not on file   Years of education: Not on file   Highest education level: Not on file  Occupational History   Not on file  Tobacco Use   Smoking status: Every Day    Types: Cigarettes   Smokeless tobacco: Never  Substance and Sexual Activity   Alcohol use: Not Currently   Drug use: Not Currently   Sexual activity: Not Currently  Other Topics Concern   Not on file  Social History Narrative   Not on file   Social Determinants of Health   Financial Resource Strain: Not on file  Food Insecurity: Not on file  Transportation Needs: Not on file  Physical Activity: Not on file  Stress: Not on file  Social Connections: Not on file     Family History: The patient's  family history is not on file.  ROS:   Review of Systems  Constitution: Negative for decreased appetite, fever and weight gain.  HENT: Negative for congestion, ear discharge, hoarse voice and sore throat.   Eyes: Negative for discharge, redness, vision loss in right eye and visual halos.  Cardiovascular: Reports chest pain, dyspnea on exertion, leg swelling, and palpitations.  Negative for orthopnea...................[name] Respiratory: Negative for cough, hemoptysis, shortness of breath and snoring.   Endocrine: Negative for heat intolerance and polyphagia.  Hematologic/Lymphatic: Negative for bleeding problem. Does not bruise/bleed easily.  Skin: Negative for flushing, nail changes, rash and suspicious lesions.  Musculoskeletal:  Negative for arthritis, joint pain, muscle cramps, myalgias, neck pain and stiffness.  Gastrointestinal: Negative for abdominal pain, bowel incontinence, diarrhea and excessive appetite.  Genitourinary: Negative for decreased libido, genital sores and incomplete emptying.  Neurological: Negative for brief paralysis, focal weakness, headaches and loss of balance.  Psychiatric/Behavioral: Negative for altered mental status, depression and suicidal ideas.  Allergic/Immunologic: Negative for HIV exposure and persistent infections.    EKGs/Labs/Other Studies Reviewed:    The following studies were reviewed today:   EKG:  The ekg ordered today demonstrates sinus rhythm, heart rate 74 bpm.  With left axis deviation.  Vascular ultrasound bilateral carotid in August 2022 shows right proximal internal carotid artery demonstrated less than 50% stenosis.  Left proximal internal carotid artery less than 50%.    Abdominal ultrasound done September 08, 2020 showed small distal aortic aneurysm measuring 3 cm. FINDINGS:   Aorta and common iliac arteries: Atherosclerotic changes of the aorta.    Proximal aorta measures  2.1 x 2.1 cm.    Mid aorta measures 2.8 x 2.6 cm.    Distal aorta measures  3 x 3 cm.    Common iliac arteries obscured by bowel gas.  Screening chest CT for lungs 09/08/2020 FINDINGS:   Lungs and pleura:  Patent central airways. No pleural effusion or pneumothorax. Centrilobular emphysema.   Mediastinum/Soft Tissues:  Coronary artery calcifications are present.. No adenopathy.   Upper Abdomen:  Grossly unremarkable allowing for low dose technique.   Bones:  No acute or aggressive bony abnormality.   Nodule assessment:  Chest Summary Report   Recent Labs: No results found for requested labs within last 8760 hours.  Recent Lipid Panel    Component Value Date/Time   CHOL 195 10/21/2007 0849   TRIG 165 (H) 10/21/2007 0849   HDL 33.4 (L) 10/21/2007 0849   CHOLHDL 5.8 CALC  10/21/2007 0849   VLDL 33 10/21/2007 0849   LDLCALC 129 (H) 10/21/2007 0849    Physical Exam:    VS:  BP 102/70 (BP Location: Right Arm, Patient Position: Sitting)   Pulse 80   Ht '5\' 7"'$  (1.702 m)   Wt 177 lb (80.3 kg)   SpO2 94%   BMI 27.72 kg/m     Wt Readings from Last 3 Encounters:  10/27/20 177 lb (80.3 kg)     GEN: Well nourished, well developed in no acute distress HEENT: Normal NECK: No JVD; No carotid bruits LYMPHATICS: No lymphadenopathy CARDIAC: S1S2 noted,RRR, no murmurs, rubs, gallops RESPIRATORY:  Clear to auscultation without rales, wheezing or rhonchi  ABDOMEN: Soft, non-tender, non-distended, +bowel sounds, no guarding. EXTREMITIES: No edema, No cyanosis, no clubbing MUSCULOSKELETAL:  No deformity  SKIN: Warm and dry NEUROLOGIC:  Alert and oriented x 3, non-focal PSYCHIATRIC:  Normal affect, good insight  ASSESSMENT:    1. Chest pain of uncertain etiology   2. AAA (  abdominal aortic aneurysm) without rupture (HCC)   3. Carotid atherosclerosis, bilateral   4. Tobacco use   5. DOE (dyspnea on exertion)   6. PVC (premature ventricular contraction)   7. Palpitations   8. Mixed hyperlipidemia    PLAN:    Her chest pain is concerning for angina given her risk factors I like to proceed with a left heart catheterization in this patient.  I was able to speak to her about the testing.  The patient understands that risks include but are not limited to stroke (1 in 1000), death (1 in 13), kidney failure [usually temporary] (1 in 500), bleeding (1 in 200), allergic reaction [possibly serious] (1 in 200), and agrees to proceed.  Sublingual nitroglycerin prescription was sent, its protocol and 911 protocol explained and the patient vocalized understanding questions were answered to the patient's satisfaction  In addition for palpitations we will place a monitor on the patient to make sure she is not recurrent SVT.  Lipid profile which was done on August 20, 2020  showed total cholesterol 205, triglycerides 187, HDL 40, LDL 132.  We will continue her Lipitor which was recently started by her PCP.  Smoking cessation advised  We will reassess her LV function as well to we will get an echocardiogram.  The patient is in agreement with the above plan. The patient left the office in stable condition.  The patient will follow up in 3 months she plans to follow-up with me in [place].   Medication Adjustments/Labs and Tests Ordered: Current medicines are reviewed at length with the patient today.  Concerns regarding medicines are outlined above.  Orders Placed This Encounter  Procedures   Basic metabolic panel   CBC with Differential/Platelet   Magnesium   LONG TERM MONITOR (3-14 DAYS)   EKG 12-Lead   ECHOCARDIOGRAM COMPLETE    Meds ordered this encounter  Medications   nitroGLYCERIN (NITROSTAT) 0.4 MG SL tablet    Sig: Place 1 tablet (0.4 mg total) under the tongue every 5 (five) minutes as needed for chest pain.    Dispense:  25 tablet    Refill:  3     Patient Instructions  Medication Instructions:  Your physician has recommended you make the following change in your medication:  START: Nitroglycerin 0.4 mg take one tablet by mouth every 5 minutes up to three times as needed for chest pain.  *If you need a refill on your cardiac medications before your next appointment, please call your pharmacy*   Lab Work: Your physician recommends that you return for lab work in:  TODAY: BMET, [place], CBC If you have labs (blood work) drawn today and your tests are completely normal, you will receive your results only by: MyChart Message (if you have MyChart) OR A paper copy in the mail If you have any lab test that is abnormal or we need to change your treatment, we will call you to review the results.   Testing/Procedures:  [place]  [id number] Dept: [phone] Loc: [place]  10/27/2020  You are scheduled for a Cardiac Catheterization on Monday, August 22 with Dr. Lauree Chandler.  1. Please arrive at the [facility] (Main Entrance A) at [facility]: [address] [place], [place] [address] at 5:30 AM (This time is two hours before your procedure to ensure your preparation). Free valet parking service is available.   Special  note: Every effort is made to have your procedure done on time. Please understand that emergencies sometimes delay scheduled procedures.  2. Diet: Do not eat solid foods after midnight.  The patient may have clear liquids until 5am upon the day of the procedure.  3. Labs: You will need to have blood drawn on TODAY.  4. Medication instructions in preparation for your procedure:   Contrast Allergy: No  On the morning of your procedure, take your Aspirin and any morning medicines NOT listed above.  You may use sips of water.  5. Plan for one night stay--bring personal belongings. 6. Bring a current list of your medications and current insurance cards. 7. You MUST have a responsible person to drive you home. 8. Someone MUST be with you the first 24 hours after you arrive home or your discharge will be delayed. 9. Please wear clothes that are easy to get on and off and wear slip-on shoes.  Thank you for allowing [place] to care for you!   -- [facility]  A zio monitor was ordered today. It will remain on for 14 days. You will then return monitor and event diary in provided box. It takes 1-2 weeks for report to be downloaded and returned to [place]. We will call you with the results. If monitor falls off or has orange flashing light, please call Zio for further instructions.   Your physician has requested that you have an echocardiogram. Echocardiography is a painless test that uses sound waves to create images of your heart. It provides  your doctor with information about the size and shape of your heart and how well your heart's chambers and valves are working. This procedure takes approximately one hour. There are no restrictions for this procedure.   Follow-Up: At [facility], you and your health needs are our priority.  As part of our continuing mission to provide you with exceptional heart care, we have created designated Provider Care Teams.  These Care Teams include your primary Cardiologist (physician) and Advanced Practice Providers (APPs -  Physician Assistants and Nurse Practitioners) who all work together to provide you with the care you need, when you need it.  We recommend signing up for the patient portal called "MyChart".  Sign up information is provided on this After Visit Summary.  MyChart is used to connect with patients for Virtual Visits (Telemedicine).  Patients are able to view lab/test results, encounter notes, upcoming appointments, etc.  Non-urgent messages can be sent to your provider as well.   To learn more about what you can do with MyChart, go to [email or web address].    Your next appointment:   2 weeks  The format for your next appointment:   In Person      Other Instructions Echocardiogram An echocardiogram is a test that uses sound waves (ultrasound) to produce images of the heart. Images from an echocardiogram can provide important information about: Heart size and shape. The size and thickness and movement of your heart's walls. Heart muscle function and strength. Heart valve function or if you have stenosis. Stenosis is when the heart valves are too narrow. If blood is flowing backward through the heart valves (regurgitation). A tumor or infectious growth around the heart valves. Areas of heart muscle that are not working well because of poor blood flow or injury from a heart attack. Aneurysm detection. An aneurysm is a weak or damaged part of an artery wall. The wall bulges  out from the  normal force of blood pumping through the body. Tell a health care provider about: Any allergies you have. All medicines you are taking, including vitamins, herbs, eye drops, creams, and over-the-counter medicines. Any blood disorders you have. Any surgeries you have had. Any medical conditions you have. Whether you are pregnant or may be pregnant. What are the risks? Generally, this is a safe test. However, problems may occur, including an allergic reaction to dye (contrast) that may be used during the test. What happens before the test? No specific preparation is needed. You may eat and drink normally. What happens during the test?  You will take off your clothes from the waist up and put on a hospital gown. Electrodes or electrocardiogram (ECG)patches may be placed on your chest. The electrodes or patches are then connected to a device that monitors your heart rate and rhythm. You will lie down on a table for an ultrasound exam. A gel will be applied to your chest to help sound waves pass through your skin. A handheld device, called a transducer, will be pressed against your chest and moved over your heart. The transducer produces sound waves that travel to your heart and bounce back (or "echo" back) to the transducer. These sound waves will be captured in real-time and changed into images of your heart that can be viewed on a video monitor. The images will be recorded on a computer and reviewed by your health care provider. You may be asked to change positions or hold your breath for a short time. This makes it easier to get different views or better views of your heart. In some cases, you may receive contrast through an IV in one of your veins. This can improve the quality of the pictures from your heart. The procedure may vary among health care providers and hospitals. What can I expect after the test? You may return to your normal, everyday life, including diet, activities,  andmedicines, unless your health care provider tells you not to do that. Follow these instructions at home: It is up to you to get the results of your test. Ask your health care provider, or the department that is doing the test, when your results will be ready. Keep all follow-up visits. This is important. Summary An echocardiogram is a test that uses sound waves (ultrasound) to produce images of the heart. Images from an echocardiogram can provide important information about the size and shape of your heart, heart muscle function, heart valve function, and other possible heart problems. You do not need to do anything to prepare before this test. You may eat and drink normally. After the echocardiogram is completed, you may return to your normal, everyday life, unless your health care provider tells you not to do that. This information is not intended to replace advice given to you by your health care provider. Make sure you discuss any questions you have with your healthcare provider. Document Revised: 10/21/2019 Document Reviewed: 10/21/2019 Elsevier Patient Education  2022 [employer].    Adopting a Healthy Lifestyle.  Know what a healthy weight is for you (roughly BMI <25) and aim to maintain this   Aim for 7+ servings of fruits and vegetables daily   65-80+ fluid ounces of water or unsweet tea for healthy kidneys   Limit to max 1 drink of alcohol per day; avoid smoking/tobacco   Limit animal fats in diet for cholesterol and heart health - choose grass fed whenever available   Avoid highly processed foods,  and foods high in saturated/trans fats   Aim for low stress - take time to unwind and care for your mental health   Aim for 150 min of moderate intensity exercise weekly for heart health, and weights twice weekly for bone health   Aim for 7-9 hours of sleep daily   When it [name] to diets, agreement about the perfect plan isnt easy to find, even among the experts. Experts  at the [employer] developed an idea known as the Healthy Eating Plate. Just imagine a plate divided into logical, healthy portions.   The emphasis is on diet quality:   Load up on vegetables and fruits - one-half of your plate: Aim for color and variety, and remember that potatoes dont count.   Go for whole grains - one-quarter of your plate: Whole wheat, barley, wheat berries, quinoa, oats, brown rice, and foods made with them. If you want pasta, go with whole wheat pasta.   Protein power - one-quarter of your plate: Fish, chicken, beans, and nuts are all healthy, versatile protein sources. Limit red meat.   The diet, however, does go beyond the plate, offering a few other suggestions.   Use healthy plant oils, such as olive, canola, soy, corn, sunflower and peanut. Check the labels, and avoid partially hydrogenated oil, which have unhealthy trans fats.   If youre thirsty, drink water. Coffee and tea are good in moderation, but skip sugary drinks and limit milk and dairy products to one or two daily servings.   The type of carbohydrate in the diet is more important than the amount. Some sources of carbohydrates, such as vegetables, fruits, whole grains, and beans-are healthier than others.   Finally, stay active  Signed, Lindsey Salines, DO  10/27/2020 12:12 PM    [place]

## 2020-10-27 NOTE — Progress Notes (Signed)
Cardiology Office Note:    Date:  10/27/2020   ID:  Kehly, Bocock [birth year], MRN [id number]  PCP:  Rich Fuchs, PA  Cardiologist:  Berniece Salines, DO  Electrophysiologist:  None   Referring MD: Rich Fuchs, PA   " I am having chest pain"  History of Present Illness:    [name] is a 65 y.o. female with a hx of SVT status post ablation and followed Dr. Caryl Comes for many years and last saw him in 2008, hyperlipidemia recently started on atorvastatin, history of left and right breast cancer with radiation and lumpectomy, current smoker, family history of premature coronary artery disease, recently diagnosed abdominal aortic aneurysm and bilateral carotid atherosclerosis.  The patient is here today with multiple complaints.  She tells me that her most pressing complaint is the fact that she has been experiencing progressive chest pain.  At first she said it was occurring on exertion.  She notes that recently this pain has been waking her up at night and also happens when she is seated.  She describes it as a midsternal chest pressure-like sensation.  She notes that she has radiation of this pain once is started to her left arm and also to her jaw.  It comes on and lasts for 5 to 10 minutes.  Nothing makes it worse.  She is really concerned because the pain is absolutely different from the pain she experienced when she had her SVT.  At that time she had chest tightness.  She also reports that she is experiencing palpitations which is abrupt onset of fast heartbeat which comes along with dizziness and shortness of breath.  Past Medical History:  Diagnosis Date   AAA (abdominal aortic aneurysm) (HCC)    Cognitive change    SVT (supraventricular tachycardia) (HCC)     Past Surgical History:  Procedure Laterality Date   ABLATION     3times   APPENDECTOMY     MASTECTOMY PARTIAL / LUMPECTOMY Left    Multiple fx repaired     TOTAL ABDOMINAL HYSTERECTOMY      Current  Medications: Current Meds  Medication Sig   atorvastatin (LIPITOR) 20 MG tablet Take 20 mg by mouth daily.   nitroGLYCERIN (NITROSTAT) 0.4 MG SL tablet Place 1 tablet (0.4 mg total) under the tongue every 5 (five) minutes as needed for chest pain.   topiramate (TOPAMAX) 100 MG tablet Take 200 mg by mouth 2 (two) times daily.     Allergies:   Latex, Other, Codeine, and Tape   Social History   Socioeconomic History   Marital status: Married    Spouse name: Not on file   Number of children: Not on file   Years of education: Not on file   Highest education level: Not on file  Occupational History   Not on file  Tobacco Use   Smoking status: Every Day    Types: Cigarettes   Smokeless tobacco: Never  Substance and Sexual Activity   Alcohol use: Not Currently   Drug use: Not Currently   Sexual activity: Not Currently  Other Topics Concern   Not on file  Social History Narrative   Not on file   Social Determinants of Health   Financial Resource Strain: Not on file  Food Insecurity: Not on file  Transportation Needs: Not on file  Physical Activity: Not on file  Stress: Not on file  Social Connections: Not on file     Family History: The patient's  family history is not on file.  ROS:   Review of Systems  Constitution: Negative for decreased appetite, fever and weight gain.  HENT: Negative for congestion, ear discharge, hoarse voice and sore throat.   Eyes: Negative for discharge, redness, vision loss in right eye and visual halos.  Cardiovascular: Reports chest pain, dyspnea on exertion, leg swelling, and palpitations.  Negative for orthopnea...................[name] Respiratory: Negative for cough, hemoptysis, shortness of breath and snoring.   Endocrine: Negative for heat intolerance and polyphagia.  Hematologic/Lymphatic: Negative for bleeding problem. Does not bruise/bleed easily.  Skin: Negative for flushing, nail changes, rash and suspicious lesions.  Musculoskeletal:  Negative for arthritis, joint pain, muscle cramps, myalgias, neck pain and stiffness.  Gastrointestinal: Negative for abdominal pain, bowel incontinence, diarrhea and excessive appetite.  Genitourinary: Negative for decreased libido, genital sores and incomplete emptying.  Neurological: Negative for brief paralysis, focal weakness, headaches and loss of balance.  Psychiatric/Behavioral: Negative for altered mental status, depression and suicidal ideas.  Allergic/Immunologic: Negative for HIV exposure and persistent infections.    EKGs/Labs/Other Studies Reviewed:    The following studies were reviewed today:   EKG:  The ekg ordered today demonstrates sinus rhythm, heart rate 74 bpm.  With left axis deviation.  Vascular ultrasound bilateral carotid in August 2022 shows right proximal internal carotid artery demonstrated less than 50% stenosis.  Left proximal internal carotid artery less than 50%.    Abdominal ultrasound done September 08, 2020 showed small distal aortic aneurysm measuring 3 cm. FINDINGS:   Aorta and common iliac arteries: Atherosclerotic changes of the aorta.    Proximal aorta measures  2.1 x 2.1 cm.    Mid aorta measures 2.8 x 2.6 cm.    Distal aorta measures  3 x 3 cm.    Common iliac arteries obscured by bowel gas.  Screening chest CT for lungs 09/08/2020 FINDINGS:   Lungs and pleura:  Patent central airways. No pleural effusion or pneumothorax. Centrilobular emphysema.   Mediastinum/Soft Tissues:  Coronary artery calcifications are present.. No adenopathy.   Upper Abdomen:  Grossly unremarkable allowing for low dose technique.   Bones:  No acute or aggressive bony abnormality.   Nodule assessment:  Chest Summary Report   Recent Labs: No results found for requested labs within last 8760 hours.  Recent Lipid Panel    Component Value Date/Time   CHOL 195 10/21/2007 0849   TRIG 165 (H) 10/21/2007 0849   HDL 33.4 (L) 10/21/2007 0849   CHOLHDL 5.8 CALC  10/21/2007 0849   VLDL 33 10/21/2007 0849   LDLCALC 129 (H) 10/21/2007 0849    Physical Exam:    VS:  BP 102/70 (BP Location: Right Arm, Patient Position: Sitting)   Pulse 80   Ht '5\' 7"'$  (1.702 m)   Wt 177 lb (80.3 kg)   SpO2 94%   BMI 27.72 kg/m     Wt Readings from Last 3 Encounters:  10/27/20 177 lb (80.3 kg)     GEN: Well nourished, well developed in no acute distress HEENT: Normal NECK: No JVD; No carotid bruits LYMPHATICS: No lymphadenopathy CARDIAC: S1S2 noted,RRR, no murmurs, rubs, gallops RESPIRATORY:  Clear to auscultation without rales, wheezing or rhonchi  ABDOMEN: Soft, non-tender, non-distended, +bowel sounds, no guarding. EXTREMITIES: No edema, No cyanosis, no clubbing MUSCULOSKELETAL:  No deformity  SKIN: Warm and dry NEUROLOGIC:  Alert and oriented x 3, non-focal PSYCHIATRIC:  Normal affect, good insight  ASSESSMENT:    1. Chest pain of uncertain etiology   2. AAA (  abdominal aortic aneurysm) without rupture (HCC)   3. Carotid atherosclerosis, bilateral   4. Tobacco use   5. DOE (dyspnea on exertion)   6. PVC (premature ventricular contraction)   7. Palpitations   8. Mixed hyperlipidemia    PLAN:    Her chest pain is concerning for angina given her risk factors I like to proceed with a left heart catheterization in this patient.  I was able to speak to her about the testing.  The patient understands that risks include but are not limited to stroke (1 in 1000), death (1 in 37), kidney failure [usually temporary] (1 in 500), bleeding (1 in 200), allergic reaction [possibly serious] (1 in 200), and agrees to proceed.  Sublingual nitroglycerin prescription was sent, its protocol and 911 protocol explained and the patient vocalized understanding questions were answered to the patient's satisfaction  In addition for palpitations we will place a monitor on the patient to make sure she is not recurrent SVT.  Lipid profile which was done on August 20, 2020  showed total cholesterol 205, triglycerides 187, HDL 40, LDL 132.  We will continue her Lipitor which was recently started by her PCP.  Smoking cessation advised  We will reassess her LV function as well to we will get an echocardiogram.  The patient is in agreement with the above plan. The patient left the office in stable condition.  The patient will follow up in 3 months she plans to follow-up with me in [place].   Medication Adjustments/Labs and Tests Ordered: Current medicines are reviewed at length with the patient today.  Concerns regarding medicines are outlined above.  Orders Placed This Encounter  Procedures   Basic metabolic panel   CBC with Differential/Platelet   Magnesium   LONG TERM MONITOR (3-14 DAYS)   EKG 12-Lead   ECHOCARDIOGRAM COMPLETE    Meds ordered this encounter  Medications   nitroGLYCERIN (NITROSTAT) 0.4 MG SL tablet    Sig: Place 1 tablet (0.4 mg total) under the tongue every 5 (five) minutes as needed for chest pain.    Dispense:  25 tablet    Refill:  3     Patient Instructions  Medication Instructions:  Your physician has recommended you make the following change in your medication:  START: Nitroglycerin 0.4 mg take one tablet by mouth every 5 minutes up to three times as needed for chest pain.  *If you need a refill on your cardiac medications before your next appointment, please call your pharmacy*   Lab Work: Your physician recommends that you return for lab work in:  TODAY: BMET, [place], CBC If you have labs (blood work) drawn today and your tests are completely normal, you will receive your results only by: MyChart Message (if you have MyChart) OR A paper copy in the mail If you have any lab test that is abnormal or we need to change your treatment, we will call you to review the results.   Testing/Procedures:  [place]  [id number] Dept: [phone] Loc: [place]  10/27/2020  You are scheduled for a Cardiac Catheterization on Monday, August 22 with Dr. Lauree Chandler.  1. Please arrive at the [facility] (Main Entrance A) at [facility]: [address] [place], [place] [address] at 5:30 AM (This time is two hours before your procedure to ensure your preparation). Free valet parking service is available.   Special  note: Every effort is made to have your procedure done on time. Please understand that emergencies sometimes delay scheduled procedures.  2. Diet: Do not eat solid foods after midnight.  The patient may have clear liquids until 5am upon the day of the procedure.  3. Labs: You will need to have blood drawn on TODAY.  4. Medication instructions in preparation for your procedure:   Contrast Allergy: No  On the morning of your procedure, take your Aspirin and any morning medicines NOT listed above.  You may use sips of water.  5. Plan for one night stay--bring personal belongings. 6. Bring a current list of your medications and current insurance cards. 7. You MUST have a responsible person to drive you home. 8. Someone MUST be with you the first 24 hours after you arrive home or your discharge will be delayed. 9. Please wear clothes that are easy to get on and off and wear slip-on shoes.  Thank you for allowing [place] to care for you!   -- [facility]  A zio monitor was ordered today. It will remain on for 14 days. You will then return monitor and event diary in provided box. It takes 1-2 weeks for report to be downloaded and returned to [place]. We will call you with the results. If monitor falls off or has orange flashing light, please call Zio for further instructions.   Your physician has requested that you have an echocardiogram. Echocardiography is a painless test that uses sound waves to create images of your heart. It provides  your doctor with information about the size and shape of your heart and how well your heart's chambers and valves are working. This procedure takes approximately one hour. There are no restrictions for this procedure.   Follow-Up: At [facility], you and your health needs are our priority.  As part of our continuing mission to provide you with exceptional heart care, we have created designated Provider Care Teams.  These Care Teams include your primary Cardiologist (physician) and Advanced Practice Providers (APPs -  Physician Assistants and Nurse Practitioners) who all work together to provide you with the care you need, when you need it.  We recommend signing up for the patient portal called "MyChart".  Sign up information is provided on this After Visit Summary.  MyChart is used to connect with patients for Virtual Visits (Telemedicine).  Patients are able to view lab/test results, encounter notes, upcoming appointments, etc.  Non-urgent messages can be sent to your provider as well.   To learn more about what you can do with MyChart, go to [email or web address].    Your next appointment:   2 weeks  The format for your next appointment:   In Person      Other Instructions Echocardiogram An echocardiogram is a test that uses sound waves (ultrasound) to produce images of the heart. Images from an echocardiogram can provide important information about: Heart size and shape. The size and thickness and movement of your heart's walls. Heart muscle function and strength. Heart valve function or if you have stenosis. Stenosis is when the heart valves are too narrow. If blood is flowing backward through the heart valves (regurgitation). A tumor or infectious growth around the heart valves. Areas of heart muscle that are not working well because of poor blood flow or injury from a heart attack. Aneurysm detection. An aneurysm is a weak or damaged part of an artery wall. The wall bulges  out from the  normal force of blood pumping through the body. Tell a health care provider about: Any allergies you have. All medicines you are taking, including vitamins, herbs, eye drops, creams, and over-the-counter medicines. Any blood disorders you have. Any surgeries you have had. Any medical conditions you have. Whether you are pregnant or may be pregnant. What are the risks? Generally, this is a safe test. However, problems may occur, including an allergic reaction to dye (contrast) that may be used during the test. What happens before the test? No specific preparation is needed. You may eat and drink normally. What happens during the test?  You will take off your clothes from the waist up and put on a hospital gown. Electrodes or electrocardiogram (ECG)patches may be placed on your chest. The electrodes or patches are then connected to a device that monitors your heart rate and rhythm. You will lie down on a table for an ultrasound exam. A gel will be applied to your chest to help sound waves pass through your skin. A handheld device, called a transducer, will be pressed against your chest and moved over your heart. The transducer produces sound waves that travel to your heart and bounce back (or "echo" back) to the transducer. These sound waves will be captured in real-time and changed into images of your heart that can be viewed on a video monitor. The images will be recorded on a computer and reviewed by your health care provider. You may be asked to change positions or hold your breath for a short time. This makes it easier to get different views or better views of your heart. In some cases, you may receive contrast through an IV in one of your veins. This can improve the quality of the pictures from your heart. The procedure may vary among health care providers and hospitals. What can I expect after the test? You may return to your normal, everyday life, including diet, activities,  andmedicines, unless your health care provider tells you not to do that. Follow these instructions at home: It is up to you to get the results of your test. Ask your health care provider, or the department that is doing the test, when your results will be ready. Keep all follow-up visits. This is important. Summary An echocardiogram is a test that uses sound waves (ultrasound) to produce images of the heart. Images from an echocardiogram can provide important information about the size and shape of your heart, heart muscle function, heart valve function, and other possible heart problems. You do not need to do anything to prepare before this test. You may eat and drink normally. After the echocardiogram is completed, you may return to your normal, everyday life, unless your health care provider tells you not to do that. This information is not intended to replace advice given to you by your health care provider. Make sure you discuss any questions you have with your healthcare provider. Document Revised: 10/21/2019 Document Reviewed: 10/21/2019 Elsevier Patient Education  2022 [employer].    Adopting a Healthy Lifestyle.  Know what a healthy weight is for you (roughly BMI <25) and aim to maintain this   Aim for 7+ servings of fruits and vegetables daily   65-80+ fluid ounces of water or unsweet tea for healthy kidneys   Limit to max 1 drink of alcohol per day; avoid smoking/tobacco   Limit animal fats in diet for cholesterol and heart health - choose grass fed whenever available   Avoid highly processed foods,  and foods high in saturated/trans fats   Aim for low stress - take time to unwind and care for your mental health   Aim for 150 min of moderate intensity exercise weekly for heart health, and weights twice weekly for bone health   Aim for 7-9 hours of sleep daily   When it comes to diets, agreement about the perfect plan isnt easy to find, even among the experts. Experts  at the [employer] developed an idea known as the Healthy Eating Plate. Just imagine a plate divided into logical, healthy portions.   The emphasis is on diet quality:   Load up on vegetables and fruits - one-half of your plate: Aim for color and variety, and remember that potatoes dont count.   Go for whole grains - one-quarter of your plate: Whole wheat, barley, wheat berries, quinoa, oats, brown rice, and foods made with them. If you want pasta, go with whole wheat pasta.   Protein power - one-quarter of your plate: Fish, chicken, beans, and nuts are all healthy, versatile protein sources. Limit red meat.   The diet, however, does go beyond the plate, offering a few other suggestions.   Use healthy plant oils, such as olive, canola, soy, corn, sunflower and peanut. Check the labels, and avoid partially hydrogenated oil, which have unhealthy trans fats.   If youre thirsty, drink water. Coffee and tea are good in moderation, but skip sugary drinks and limit milk and dairy products to one or two daily servings.   The type of carbohydrate in the diet is more important than the amount. Some sources of carbohydrates, such as vegetables, fruits, whole grains, and beans-are healthier than others.   Finally, stay active  Signed, Berniece Salines, DO  10/27/2020 12:12 PM    [place]

## 2020-10-28 ENCOUNTER — Telehealth: Payer: Self-pay | Admitting: *Deleted

## 2020-10-28 LAB — BASIC METABOLIC PANEL
BUN/Creatinine Ratio: 37 — ABNORMAL HIGH (ref 12–28)
BUN: 31 mg/dL — ABNORMAL HIGH (ref 8–27)
CO2: 18 mmol/L — ABNORMAL LOW (ref 20–29)
Calcium: 10.6 mg/dL — ABNORMAL HIGH (ref 8.7–10.3)
Chloride: 109 mmol/L — ABNORMAL HIGH (ref 96–106)
Creatinine, Ser: 0.83 mg/dL (ref 0.57–1.00)
Glucose: 81 mg/dL (ref 65–99)
Potassium: 4.7 mmol/L (ref 3.5–5.2)
Sodium: 143 mmol/L (ref 134–144)
eGFR: 78 mL/min/{1.73_m2} (ref >59)

## 2020-10-28 LAB — CBC WITH DIFFERENTIAL/PLATELET
Basophils Absolute: 0.1 10*3/uL (ref 0.0–0.2)
Basos: 1 %
EOS (ABSOLUTE): 0.4 10*3/uL (ref 0.0–0.4)
Eos: 4 %
Hematocrit: 49.6 % — ABNORMAL HIGH (ref 34.0–46.6)
Hemoglobin: 16.6 g/dL — ABNORMAL HIGH (ref 11.1–15.9)
Immature Grans (Abs): 0 10*3/uL (ref 0.0–0.1)
Immature Granulocytes: 0 %
Lymphocytes Absolute: 3.2 10*3/uL — ABNORMAL HIGH (ref 0.7–3.1)
Lymphs: 34 %
MCH: 31.4 pg (ref 26.6–33.0)
MCHC: 33.5 g/dL (ref 31.5–35.7)
MCV: 94 fL (ref 79–97)
Monocytes Absolute: 0.7 10*3/uL (ref 0.1–0.9)
Monocytes: 8 %
Neutrophils Absolute: 4.9 10*3/uL (ref 1.4–7.0)
Neutrophils: 53 %
Platelets: 204 10*3/uL (ref 150–450)
RBC: 5.29 x10E6/uL — ABNORMAL HIGH (ref 3.77–5.28)
RDW: 12.9 % (ref 11.7–15.4)
WBC: 9.3 10*3/uL (ref 3.4–10.8)

## 2020-10-28 LAB — MAGNESIUM: Magnesium: 2.3 mg/dL (ref 1.6–2.3)

## 2020-10-28 NOTE — Telephone Encounter (Signed)
Cardiac catheterization scheduled at [facility] for: Monday November 01, 2020 7:30 [facility] Main Entrance A [facility]) at: 5:30 AM   No solid food after midnight prior to cath, clear liquids until 5 AM day of procedure.   Morning medications can be taken pre-cath with sips of water including aspirin 81 mg.    Confirmed patient has responsible adult to drive home post procedure and be with patient first 24 hours after arriving home.  Patients are allowed one visitor in the waiting room during the time they are at the hospital for their procedure. Both patient and visitor must wear a mask once they enter the hospital.   Patient reports does not currently have any symptoms concerning for COVID-19 and no household members with COVID-19 like illness.   Reviewed procedure/mask/visitor instructions with patient.

## 2020-11-01 ENCOUNTER — Encounter (HOSPITAL_COMMUNITY)
Admission: RE | Disposition: A | Discharge status: Home / Self Care | Payer: Self-pay | Source: Home / Self Care | Attending: Cardiovascular Disease

## 2020-11-01 ENCOUNTER — Ambulatory Visit (HOSPITAL_COMMUNITY)
Admission: RE | Admit: 2020-11-01 | Discharge: 2020-11-01 | Disposition: A | Discharge status: Home / Self Care | Payer: Medicare Other | Attending: Cardiovascular Disease | Admitting: Cardiovascular Disease

## 2020-11-01 ENCOUNTER — Encounter (HOSPITAL_COMMUNITY): Payer: Self-pay | Admitting: Cardiovascular Disease

## 2020-11-01 ENCOUNTER — Other Ambulatory Visit: Payer: Self-pay

## 2020-11-01 DIAGNOSIS — I25118 Atherosclerotic heart disease of native coronary artery with other forms of angina pectoris: Secondary | ICD-10-CM

## 2020-11-01 DIAGNOSIS — Z79899 Other long term (current) drug therapy: Secondary | ICD-10-CM | POA: Insufficient documentation

## 2020-11-01 DIAGNOSIS — Z885 Allergy status to narcotic agent status: Secondary | ICD-10-CM | POA: Insufficient documentation

## 2020-11-01 DIAGNOSIS — Z91048 Other nonmedicinal substance allergy status: Secondary | ICD-10-CM | POA: Diagnosis not present

## 2020-11-01 DIAGNOSIS — Z8249 Family history of ischemic heart disease and other diseases of the circulatory system: Secondary | ICD-10-CM | POA: Diagnosis not present

## 2020-11-01 DIAGNOSIS — R002 Palpitations: Secondary | ICD-10-CM | POA: Insufficient documentation

## 2020-11-01 DIAGNOSIS — R079 Chest pain, unspecified: Secondary | ICD-10-CM | POA: Diagnosis present

## 2020-11-01 DIAGNOSIS — Z91040 Latex allergy status: Secondary | ICD-10-CM | POA: Diagnosis not present

## 2020-11-01 DIAGNOSIS — F17210 Nicotine dependence, cigarettes, uncomplicated: Secondary | ICD-10-CM | POA: Insufficient documentation

## 2020-11-01 DIAGNOSIS — I493 Ventricular premature depolarization: Secondary | ICD-10-CM | POA: Diagnosis not present

## 2020-11-01 DIAGNOSIS — E782 Mixed hyperlipidemia: Secondary | ICD-10-CM | POA: Insufficient documentation

## 2020-11-01 DIAGNOSIS — I6523 Occlusion and stenosis of bilateral carotid arteries: Secondary | ICD-10-CM | POA: Diagnosis not present

## 2020-11-01 DIAGNOSIS — I714 Abdominal aortic aneurysm, without rupture: Secondary | ICD-10-CM | POA: Insufficient documentation

## 2020-11-01 DIAGNOSIS — R0609 Other forms of dyspnea: Secondary | ICD-10-CM | POA: Diagnosis not present

## 2020-11-01 HISTORY — PX: LEFT HEART CATH AND CORONARY ANGIOGRAPHY: CATH118249

## 2020-11-01 SURGERY — LEFT HEART CATH AND CORONARY ANGIOGRAPHY
Anesthesia: LOCAL

## 2020-11-01 MED ORDER — SODIUM CHLORIDE 0.9% FLUSH: 3.0000 mL | Freq: Two times a day (BID) | INTRAVENOUS | Status: DC

## 2020-11-01 MED ORDER — VERAPAMIL HCL 2.5 MG/ML IV SOLN
INTRAVENOUS | Status: DC | PRN
  Administered 2020-11-01: 10 mL via INTRA_ARTERIAL

## 2020-11-01 MED ORDER — LIDOCAINE HCL (PF) 1 % IJ SOLN
INTRAMUSCULAR | Status: AC
  Filled 2020-11-01: qty 30

## 2020-11-01 MED ORDER — MIDAZOLAM HCL 2 MG/2ML IJ SOLN
INTRAMUSCULAR | Status: DC | PRN
  Administered 2020-11-01: 1 mg via INTRAVENOUS

## 2020-11-01 MED ORDER — VERAPAMIL HCL 2.5 MG/ML IV SOLN
INTRAVENOUS | Status: AC
  Filled 2020-11-01: qty 2

## 2020-11-01 MED ORDER — IOHEXOL 350 MG/ML SOLN
INTRAVENOUS | Status: DC | PRN
  Administered 2020-11-01: 80 mL via INTRA_ARTERIAL

## 2020-11-01 MED ORDER — HEPARIN (PORCINE) IN NACL 1000-0.9 UT/500ML-% IV SOLN
INTRAVENOUS | Status: AC
  Filled 2020-11-01: qty 1000

## 2020-11-01 MED ORDER — SODIUM CHLORIDE 0.9 % WEIGHT BASED INFUSION: 1.0000 mL/kg/h | INTRAVENOUS | Status: DC

## 2020-11-01 MED ORDER — SODIUM CHLORIDE 0.9% FLUSH: 3.0000 mL | INTRAVENOUS | Status: DC | PRN

## 2020-11-01 MED ORDER — HEPARIN (PORCINE) IN NACL 1000-0.9 UT/500ML-% IV SOLN
INTRAVENOUS | Status: DC | PRN
  Administered 2020-11-01 (×2): 500 mL

## 2020-11-01 MED ORDER — SODIUM CHLORIDE 0.9 % IV SOLN: 250.0000 mL | INTRAVENOUS | Status: DC | PRN

## 2020-11-01 MED ORDER — HEPARIN SODIUM (PORCINE) 1000 UNIT/ML IJ SOLN
INTRAMUSCULAR | Status: DC | PRN
  Administered 2020-11-01: 4000 [IU] via INTRAVENOUS

## 2020-11-01 MED ORDER — HEPARIN SODIUM (PORCINE) 1000 UNIT/ML IJ SOLN
INTRAMUSCULAR | Status: AC
  Filled 2020-11-01: qty 1

## 2020-11-01 MED ORDER — SODIUM CHLORIDE 0.9 % WEIGHT BASED INFUSION
3.0000 mL/kg/h | INTRAVENOUS | Status: AC
  Administered 2020-11-01: 3 mL/kg/h via INTRAVENOUS

## 2020-11-01 MED ORDER — FENTANYL CITRATE (PF) 100 MCG/2ML IJ SOLN
INTRAMUSCULAR | Status: AC
  Filled 2020-11-01: qty 2

## 2020-11-01 MED ORDER — ONDANSETRON HCL 4 MG/2ML IJ SOLN: 4.0000 mg | Freq: Four times a day (QID) | INTRAMUSCULAR | Status: DC | PRN

## 2020-11-01 MED ORDER — FENTANYL CITRATE (PF) 100 MCG/2ML IJ SOLN
INTRAMUSCULAR | Status: DC | PRN
  Administered 2020-11-01: 25 ug via INTRAVENOUS

## 2020-11-01 MED ORDER — HYDRALAZINE HCL 20 MG/ML IJ SOLN: 10.0000 mg | INTRAMUSCULAR | Status: DC | PRN

## 2020-11-01 MED ORDER — ASPIRIN 81 MG PO CHEW: 81.0000 mg | CHEWABLE_TABLET | ORAL | Status: AC

## 2020-11-01 MED ORDER — LIDOCAINE HCL (PF) 1 % IJ SOLN
INTRAMUSCULAR | Status: DC | PRN
  Administered 2020-11-01: 5 mL via INTRADERMAL

## 2020-11-01 MED ORDER — ACETAMINOPHEN 325 MG PO TABS: 650.0000 mg | ORAL_TABLET | ORAL | Status: DC | PRN

## 2020-11-01 MED ORDER — LABETALOL HCL 5 MG/ML IV SOLN: 10.0000 mg | INTRAVENOUS | Status: DC | PRN

## 2020-11-01 MED ORDER — SODIUM CHLORIDE 0.9 % IV SOLN: INTRAVENOUS | Status: AC

## 2020-11-01 MED ORDER — MIDAZOLAM HCL 2 MG/2ML IJ SOLN
INTRAMUSCULAR | Status: AC
  Filled 2020-11-01: qty 2

## 2020-11-01 SURGICAL SUPPLY — 12 items
CATH 5FR JL3.5 JR4 ANG PIG MP (CATHETERS) ×1 IMPLANT
CATH INFINITI 5FR AL1 (CATHETERS) ×1 IMPLANT
CATH VISTA GUIDE 6FR XBLAD3.5 (CATHETERS) ×1 IMPLANT
DEVICE RAD COMP TR BAND LRG (VASCULAR PRODUCTS) ×1 IMPLANT
GLIDESHEATH SLEND SS 6F .021 (SHEATH) ×1 IMPLANT
GUIDEWIRE INQWIRE 1.5J.035X260 (WIRE) IMPLANT
INQWIRE 1.5J .035X260CM (WIRE) ×2
KIT HEART LEFT (KITS) ×2 IMPLANT
PACK CARDIAC CATHETERIZATION (CUSTOM PROCEDURE TRAY) ×2 IMPLANT
SYR MEDRAD MARK 7 150ML (SYRINGE) ×2 IMPLANT
TRANSDUCER W/STOPCOCK (MISCELLANEOUS) ×2 IMPLANT
TUBING CIL FLEX 10 FLL-RA (TUBING) ×2 IMPLANT

## 2020-11-01 NOTE — Interval H&P Note (Signed)
History and Physical Interval Note:  11/01/2020 7:17 AM  [name]  has presented today for surgery, with the diagnosis of chest pain.  The various methods of treatment have been discussed with the patient and family. After consideration of risks, benefits and other options for treatment, the patient has consented to  Procedure(s): LEFT HEART CATH AND CORONARY ANGIOGRAPHY (N/A) as a surgical intervention.  The patient's history has been reviewed, patient examined, no change in status, stable for surgery.  I have reviewed the patient's chart and labs.  Questions were answered to the patient's satisfaction.    Cath Lab Visit (complete for each Cath Lab visit)  Clinical Evaluation Leading to the Procedure:   ACS: No.  Non-ACS:    Anginal Classification: CCS III  Anti-ischemic medical therapy: No Therapy  Non-Invasive Test Results: No non-invasive testing performed  Prior CABG: No previous CABG        Lauree Chandler

## 2020-11-08 ENCOUNTER — Other Ambulatory Visit: Payer: Self-pay

## 2020-11-08 ENCOUNTER — Ambulatory Visit (INDEPENDENT_AMBULATORY_CARE_PROVIDER_SITE_OTHER): Payer: Medicare Other

## 2020-11-08 DIAGNOSIS — R0600 Dyspnea, unspecified: Secondary | ICD-10-CM | POA: Diagnosis not present

## 2020-11-08 DIAGNOSIS — R0609 Other forms of dyspnea: Secondary | ICD-10-CM

## 2020-11-08 LAB — ECHOCARDIOGRAM COMPLETE
Area-P 1/2: 3.33 cm2
P 1/2 time: 533 msec
S' Lateral: 3.3 cm

## 2020-11-09 ENCOUNTER — Telehealth: Payer: Self-pay

## 2020-11-09 DIAGNOSIS — I471 Supraventricular tachycardia, unspecified: Secondary | ICD-10-CM

## 2020-11-09 DIAGNOSIS — R4189 Other symptoms and signs involving cognitive functions and awareness: Secondary | ICD-10-CM | POA: Insufficient documentation

## 2020-11-09 HISTORY — DX: Supraventricular tachycardia, unspecified: I47.10

## 2020-11-09 NOTE — Telephone Encounter (Signed)
-----   Message from Berniece Salines, DO sent at 11/09/2020 10:19 AM EDT ----- The echo showed that the heart is not fully relaxing like it should ( diastolic dysfunction) ,but otherwise normal. I will discuss it at the next office visit.

## 2020-11-09 NOTE — Telephone Encounter (Signed)
Spoke with patient regarding results and recommendation.  Patient verbalizes understanding and is agreeable to plan of care. Advised patient to call back with any issues or concerns.  

## 2020-11-16 ENCOUNTER — Other Ambulatory Visit: Payer: Self-pay

## 2020-11-16 ENCOUNTER — Ambulatory Visit (INDEPENDENT_AMBULATORY_CARE_PROVIDER_SITE_OTHER): Payer: Medicare Other | Admitting: Cardiology

## 2020-11-16 ENCOUNTER — Encounter: Payer: Self-pay | Admitting: Cardiology

## 2020-11-16 VITALS — BP 132/68 | HR 96 | Ht 67.0 in | Wt 180.0 lb

## 2020-11-16 DIAGNOSIS — I25118 Atherosclerotic heart disease of native coronary artery with other forms of angina pectoris: Secondary | ICD-10-CM | POA: Diagnosis not present

## 2020-11-16 DIAGNOSIS — I471 Supraventricular tachycardia: Secondary | ICD-10-CM

## 2020-11-16 DIAGNOSIS — I714 Abdominal aortic aneurysm, without rupture, unspecified: Secondary | ICD-10-CM

## 2020-11-16 MED ORDER — METOPROLOL TARTRATE 25 MG PO TABS: 25.0000 mg | ORAL_TABLET | Freq: Two times a day (BID) | ORAL | 3 refills | Status: DC

## 2020-11-16 NOTE — Progress Notes (Signed)
Cardiology Office Note:    Date:  11/16/2020   ID:  [name] [birth year], MRN [id number]  PCP:  Rich Fuchs, PA  Cardiologist:  Jenne Campus, MD    Referring MD: Rich Fuchs, PA   No chief complaint on file. I had a lot of things done to me.  History of Present Illness:    [name] is a 65 y.o. female with past medical history significant for SVT status post ablation done by Dr. Caryl Comes about 3 times last time in 2008, hyperlipidemia on atorvastatin, history of left and right breast cancer with radiation and lumpectomy, history of family premature coronary artery disease.  Recently she was evaluated by my partner she was referred for cardiac catheterization because of her symptomatology.  Cardiac catheterization showed completely occluded left anterior descending artery in its midportion.  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  There were no target lesion for intervention.  She also was find to have abdominal aortic aneurysm measuring 3.8 cm.  She did have carotic ultrasounds which likely did not show any significant stenosis.  She comes today to my office to follow-up after all those testing being done.  She did have a lot of question that there was a long discussion with her as well as with her husband.  She still described to have some chest pain symptoms happening at rest sometimes happen with exercise.  She did not take nitroglycerin for it.  She does not exercise on the regular basis but committed to be better.  Past Medical History:  Diagnosis Date   AAA (abdominal aortic aneurysm) (HCC)    Cognitive change    SVT (supraventricular tachycardia) ([place])     Past Surgical History:  Procedure Laterality Date   ABLATION     3times   APPENDECTOMY     LEFT HEART CATH AND CORONARY ANGIOGRAPHY N/A 11/01/2020   Procedure: LEFT HEART CATH AND CORONARY ANGIOGRAPHY;  Surgeon: Burnell Blanks, MD;  Location: [facility];   Service: Cardiovascular;  Laterality: N/A;   MASTECTOMY PARTIAL / LUMPECTOMY Left    Multiple fx repaired     TOTAL ABDOMINAL HYSTERECTOMY      Current Medications: Current Meds  Medication Sig   acetaminophen (TYLENOL) 500 MG tablet Take 1,000-1,500 mg by mouth at bedtime as needed for moderate pain.   aspirin EC 81 MG tablet Take 81 mg by mouth daily. Swallow whole.   atorvastatin (LIPITOR) 20 MG tablet Take 20 mg by mouth daily.   calcium carbonate (TUMS - DOSED IN MG ELEMENTAL CALCIUM) 500 MG chewable tablet Chew 1,000 mg by mouth daily as needed for indigestion or heartburn.   Liniments (BLUE-EMU SUPER STRENGTH EX) Apply 1 application topically daily as needed (pain).   metoprolol tartrate (LOPRESSOR) 25 MG tablet Take 1 tablet (25 mg total) by mouth 2 (two) times daily.   nitroGLYCERIN (NITROSTAT) 0.4 MG SL tablet Place 1 tablet (0.4 mg total) under the tongue every 5 (five) minutes as needed for chest pain.   topiramate (TOPAMAX) 100 MG tablet Take 200 mg by mouth 2 (two) times daily.     Allergies:   Latex, Other, Codeine, and Tape   Social History   Socioeconomic History   Marital status: Married    Spouse name: Not on file   Number of children: Not on file   Years of education: Not on file   Highest education level: Not on file  Occupational History  Not on file  Tobacco Use   Smoking status: Every Day    Types: Cigarettes   Smokeless tobacco: Never  Substance and Sexual Activity   Alcohol use: Not Currently   Drug use: Not Currently   Sexual activity: Not Currently  Other Topics Concern   Not on file  Social History Narrative   Not on file   Social Determinants of Health   Financial Resource Strain: Not on file  Food Insecurity: Not on file  Transportation Needs: Not on file  Physical Activity: Not on file  Stress: Not on file  Social Connections: Not on file     Family History: The patient's family history is not on file. ROS:   Please see the  history of present illness.    All 14 point review of systems negative except as described per history of present illness  EKGs/Labs/Other Studies Reviewed:      Recent Labs: 10/27/2020: BUN 31; Creatinine, Ser 0.83; Hemoglobin 16.6; Magnesium 2.3; Platelets 204; Potassium 4.7; Sodium 143  Recent Lipid Panel    Component Value Date/Time   CHOL 195 10/21/2007 0849   TRIG 165 (H) 10/21/2007 0849   HDL 33.4 (L) 10/21/2007 0849   CHOLHDL 5.8 CALC 10/21/2007 0849   VLDL 33 10/21/2007 0849   LDLCALC 129 (H) 10/21/2007 0849    Physical Exam:    VS:  BP 132/68 (BP Location: Right Arm, Patient Position: Sitting, Cuff Size: Normal)   Pulse 96   Ht '5\' 7"'$  (1.702 m)   Wt 180 lb (81.6 kg)   SpO2 96%   BMI 28.19 kg/m     Wt Readings from Last 3 Encounters:  11/16/20 180 lb (81.6 kg)  11/01/20 177 lb (80.3 kg)  10/27/20 177 lb (80.3 kg)     GEN:  Well nourished, well developed in no acute distress HEENT: Normal NECK: No JVD; No carotid bruits LYMPHATICS: No lymphadenopathy CARDIAC: RRR, no murmurs, no rubs, no gallops RESPIRATORY:  Clear to auscultation without rales, wheezing or rhonchi  ABDOMEN: Soft, non-tender, non-distended MUSCULOSKELETAL:  No edema; No deformity  SKIN: Warm and dry LOWER EXTREMITIES: no swelling NEUROLOGIC:  Alert and oriented x 3 PSYCHIATRIC:  Normal affect   ASSESSMENT:    1. Coronary artery disease of native artery of native heart with stable angina pectoris ([place])   2. SVT (supraventricular tachycardia) (HCC)   3. Abdominal aortic aneurysm (AAA) without rupture (HCC)    PLAN:    In order of problems listed above:  Coronary disease cardiac catheterization reviewed with details in her as well as her husband.  She does have completely occluded midportion of the left anterior descending artery, lacking her left ventricle ejection fraction normal.  She is on antiplatelet therapy which I will continue..  I will also initiate beta-blockade.  I will put  her on metoprolol titrate 25 mg twice daily. Dyslipidemia: She is taking statin which I will continue. History of supraventricular tachycardia seems to be stable from that point review but we will continue monitoring. Abdominal aortic aneurysm 3.8 cm.  Her blood pressure seems to be well controlled, she is on statin she is on antiplatelet therapy we will continue monitoring. 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 Status post cardiac catheterization of the right wrist access to radial artery is healing well.   Medication Adjustments/Labs and Tests Ordered: Current medicines are reviewed at length with the patient today.  Concerns regarding medicines are outlined above.  No orders of the defined types were placed in this encounter.  Medication changes:  Meds ordered this encounter  Medications   metoprolol tartrate (LOPRESSOR) 25 MG tablet    Sig: Take 1 tablet (25 mg total) by mouth 2 (two) times daily.    Dispense:  180 tablet    Refill:  3    Signed, Park Liter, MD, [facility] 11/16/2020 11:19 AM    [place]

## 2020-11-16 NOTE — Patient Instructions (Addendum)
Medication Instructions:  Your physician has recommended you make the following change in your medication:  START: Metoprolol 25 mg take one tablet by mouth twice daily.  *If you need a refill on your cardiac medications before your next appointment, please call your pharmacy*   Lab Work: None If you have labs (blood work) drawn today and your tests are completely normal, you will receive your results only by: [place] (if you have MyChart) OR A paper copy in the mail If you have any lab test that is abnormal or we need to change your treatment, we will call you to review the results.   Testing/Procedures: None   Follow-Up: At [facility], you and your health needs are our priority.  As part of our continuing mission to provide you with exceptional heart care, we have created designated Provider Care Teams.  These Care Teams include your primary Cardiologist (physician) and Advanced Practice Providers (APPs -  Physician Assistants and Nurse Practitioners) who all work together to provide you with the care you need, when you need it.  We recommend signing up for the patient portal called "MyChart".  Sign up information is provided on this After Visit Summary.  MyChart is used to connect with patients for Virtual Visits (Telemedicine).  Patients are able to view lab/test results, encounter notes, upcoming appointments, etc.  Non-urgent messages can be sent to your provider as well.   To learn more about what you can do with MyChart, go to [email or web address].    Your next appointment:   2 month(s)  The format for your next appointment:   In Person  Provider:   Jenne Campus, MD   Other Instructions

## 2020-11-18 ENCOUNTER — Ambulatory Visit: Payer: Medicare Other | Admitting: Cardiology

## 2021-01-19 ENCOUNTER — Ambulatory Visit: Payer: Medicare Other | Admitting: Cardiology

## 2021-01-21 ENCOUNTER — Other Ambulatory Visit: Payer: Self-pay

## 2021-01-21 ENCOUNTER — Encounter: Payer: Self-pay | Admitting: Cardiology

## 2021-01-21 ENCOUNTER — Ambulatory Visit (INDEPENDENT_AMBULATORY_CARE_PROVIDER_SITE_OTHER): Payer: Medicare Other | Admitting: Cardiology

## 2021-01-21 VITALS — BP 130/76 | HR 82 | Ht 67.0 in | Wt 179.4 lb

## 2021-01-21 DIAGNOSIS — Z79899 Other long term (current) drug therapy: Secondary | ICD-10-CM

## 2021-01-21 DIAGNOSIS — I2510 Atherosclerotic heart disease of native coronary artery without angina pectoris: Secondary | ICD-10-CM | POA: Diagnosis not present

## 2021-01-21 DIAGNOSIS — E782 Mixed hyperlipidemia: Secondary | ICD-10-CM | POA: Diagnosis not present

## 2021-01-21 DIAGNOSIS — I10 Essential (primary) hypertension: Secondary | ICD-10-CM | POA: Diagnosis not present

## 2021-01-21 DIAGNOSIS — F17200 Nicotine dependence, unspecified, uncomplicated: Secondary | ICD-10-CM

## 2021-01-21 NOTE — Progress Notes (Signed)
Cardiology Office Note:    Date:  01/21/2021   ID:  Lindsey, [name] March 02, 1956, MRN [id number]  PCP:  Rich Fuchs, PA  Cardiologist:  Berniece Salines, DO  Electrophysiologist:  None   Referring MD: Rich Fuchs, PA   I am doing well   History of Present Illness:    [name] is a 64 y.o. female with a hx of coronary artery disease which was noted on recent heart catheterization for now we will pursue medical therapy, SVT status post ablation, current smoker, hypertension, hyperlipidemia, history of left femoral right breast cancer with radiation lobectomy, abdominal aortic aneurysm and bilateral carotid atherosclerosis.  She is here today for follow-up visit.  When I saw the patient in August 2022 at that time she was experiencing chest discomfort I recommend she undergo a left heart catheterization given her high risk factors for coronary artery disease.  She had her cardiac catheterization and at that time she had a mid LAD occlusion with mild nonobstructive disease in her RCA and marginal.  She has not complained of any chest discomfort.  She tells me she is doing a bit better.  Past Medical History:  Diagnosis Date   AAA (abdominal aortic aneurysm)    Cognitive change    SVT (supraventricular tachycardia) ([place])     Past Surgical History:  Procedure Laterality Date   ABLATION     3times   APPENDECTOMY     LEFT HEART CATH AND CORONARY ANGIOGRAPHY N/A 11/01/2020   Procedure: LEFT HEART CATH AND CORONARY ANGIOGRAPHY;  Surgeon: Burnell Blanks, MD;  Location: [facility];  Service: Cardiovascular;  Laterality: N/A;   MASTECTOMY PARTIAL / LUMPECTOMY Left    Multiple fx repaired     TOTAL ABDOMINAL HYSTERECTOMY      Current Medications: Current Meds  Medication Sig   acetaminophen (TYLENOL) 500 MG tablet Take 1,000-1,500 mg by mouth at bedtime as needed for moderate pain.   aspirin EC 81 MG tablet Take 81 mg by mouth every other day. Swallow  whole.   atorvastatin (LIPITOR) 20 MG tablet Take 20 mg by mouth daily.   calcium carbonate (TUMS - DOSED IN MG ELEMENTAL CALCIUM) 500 MG chewable tablet Chew 1,000 mg by mouth daily as needed for indigestion or heartburn.   Liniments (BLUE-EMU SUPER STRENGTH EX) Apply 1 application topically daily as needed (pain).   nitroGLYCERIN (NITROSTAT) 0.4 MG SL tablet Place 1 tablet (0.4 mg total) under the tongue every 5 (five) minutes as needed for chest pain.   topiramate (TOPAMAX) 100 MG tablet Take 200 mg by mouth 2 (two) times daily.     Allergies:   Latex, Other, Codeine, and Tape   Social History   Socioeconomic History   Marital status: Married    Spouse name: Not on file   Number of children: Not on file   Years of education: Not on file   Highest education level: Not on file  Occupational History   Not on file  Tobacco Use   Smoking status: Every Day    Types: Cigarettes   Smokeless tobacco: Never  Substance and Sexual Activity   Alcohol use: Not Currently   Drug use: Not Currently   Sexual activity: Not Currently  Other Topics Concern   Not on file  Social History Narrative   Not on file   Social Determinants of Health   Financial Resource Strain: Not on file  Food Insecurity: Not on file  Transportation Needs:  Not on file  Physical Activity: Not on file  Stress: Not on file  Social Connections: Not on file     Family History: The patient's family history is not on file.  ROS:   Review of Systems  Constitution: Negative for decreased appetite, fever and weight gain.  HENT: Negative for congestion, ear discharge, hoarse voice and sore throat.   Eyes: Negative for discharge, redness, vision loss in right eye and visual halos.  Cardiovascular: Negative for chest pain, dyspnea on exertion, leg swelling, orthopnea and palpitations.  Respiratory: Negative for cough, hemoptysis, shortness of breath and snoring.   Endocrine: Negative for heat intolerance and  polyphagia.  Hematologic/Lymphatic: Negative for bleeding problem. Does not bruise/bleed easily.  Skin: Negative for flushing, nail changes, rash and suspicious lesions.  Musculoskeletal: Negative for arthritis, joint pain, muscle cramps, myalgias, neck pain and stiffness.  Gastrointestinal: Negative for abdominal pain, bowel incontinence, diarrhea and excessive appetite.  Genitourinary: Negative for decreased libido, genital sores and incomplete emptying.  Neurological: Negative for brief paralysis, focal weakness, headaches and loss of balance.  Psychiatric/Behavioral: Negative for altered mental status, depression and suicidal ideas.  Allergic/Immunologic: Negative for HIV exposure and persistent infections.    EKGs/Labs/Other Studies Reviewed:    The following studies were reviewed today:   EKG: None today  Tte 11/08/2020 IMPRESSIONS   1. Vertical orientation of the LV . Left ventricular ejection fraction,  by estimation, is 55 to 60%. The left ventricle has normal function. The  left ventricle has no regional wall motion abnormalities. There is mild  concentric left ventricular  hypertrophy. Left ventricular diastolic parameters are consistent with  Grade I diastolic dysfunction (impaired relaxation).   2. Right ventricular systolic function is normal. The right ventricular  size is normal. There is normal pulmonary artery systolic pressure.   3. The mitral valve is degenerative. No evidence of mitral valve  regurgitation. No evidence of mitral stenosis.   4. The aortic valve is normal in structure. Aortic valve regurgitation is  mild. Mild aortic valve sclerosis is present, with no evidence of aortic  valve stenosis.   5. The inferior vena cava is normal in size with greater than 50%  respiratory variability, suggesting right atrial pressure of 3 mmHg.   FINDINGS   Left Ventricle: Vertical orientation of the LV. Left ventricular ejection  fraction, by estimation, is 55 to  60%. The left ventricle has normal  function. The left ventricle has no regional wall motion abnormalities.  Global longitudinal strain performed  but not reported based on interpreter judgement due to suboptimal  tracking. The left ventricular internal cavity size was normal in size.  There is mild concentric left ventricular hypertrophy. Left ventricular  diastolic parameters are consistent with  Grade I diastolic dysfunction (impaired relaxation). Normal left  ventricular filling pressure.   Right Ventricle: The right ventricular size is normal. No increase in  right ventricular wall thickness. Right ventricular systolic function is  normal. There is normal pulmonary artery systolic pressure. The tricuspid  regurgitant velocity is 2.38 m/s, and   with an assumed right atrial pressure of 3 mmHg, the estimated right  ventricular systolic pressure is 81.1 mmHg.   Left Atrium: Left atrial size was normal in size.   Right Atrium: Right atrial size was normal in size.   Pericardium: There is no evidence of pericardial effusion.   Mitral Valve: The mitral valve is degenerative in appearance. Mild mitral  annular calcification. No evidence of mitral valve regurgitation. No  evidence of mitral valve stenosis.   Tricuspid Valve: The tricuspid valve is normal in structure. Tricuspid  valve regurgitation is trivial. No evidence of tricuspid stenosis.   Aortic Valve: The aortic valve is normal in structure. Aortic valve  regurgitation is mild. Aortic regurgitation PHT measures 533 msec. Mild  aortic valve sclerosis is present, with no evidence of aortic valve  stenosis.   Pulmonic Valve: The pulmonic valve was normal in structure. Pulmonic valve  regurgitation is not visualized. No evidence of pulmonic stenosis.   Aorta: The aortic root and ascending aorta are structurally normal, with  no evidence of dilitation and the aortic arch was not well visualized.   Venous: The pulmonary veins  were not well visualized. The inferior vena  cava is normal in size with greater than 50% respiratory variability,  suggesting right atrial pressure of 3 mmHg.   IAS/Shunts: No atrial level shunt detected by color flow Doppler.   LHC 11/01/2020  Prox RCA lesion is 30% stenosed.   Mid RCA lesion is 20% stenosed.   2nd Mrg lesion is 20% stenosed.   Mid LAD lesion is 100% stenosed.   There is mild left ventricular systolic dysfunction.   LV end diastolic pressure is normal.   The left ventricular ejection fraction is 45-50% by visual estimate.   There is no mitral valve regurgitation.   The LAD is a large caliber vessel that courses to the apex. The LAD is occluded beyond the takeoff of the large diagonal branch. The LAD is diffusely diseased beyond the chronic occlusion and fills from left to left collaterals.  Mild plaque in the Circumflex The RCA is a large dominant vessel with mild aneurysmal dilation of the proximal vessel, mild plaque.  LVEF 45% with mild anterior wall motion abnormality  Recommendations: Medical management of CAD. The LAD is not a favorable target for PCI. The vessel fills from collaterals. Consider anti-anginal therapy. Echo pending.   Recent Labs: 10/27/2020: Hemoglobin 16.6; Platelets 204 01/21/2021: BUN 18; Creatinine, Ser 0.92; Magnesium 2.7; Potassium 4.2; Sodium 143; TSH 1.180  Recent Lipid Panel    Component Value Date/Time   CHOL 195 10/21/2007 0849   TRIG 126 01/21/2021 1113   HDL 33.4 (L) 10/21/2007 0849   CHOLHDL 5.8 CALC 10/21/2007 0849   VLDL 33 10/21/2007 0849   LDLCALC 68 01/21/2021 1113    Physical Exam:    VS:  BP 130/76   Pulse 82   Ht 5\' 7"  (1.702 m)   Wt 179 lb 6.4 oz (81.4 kg)   SpO2 95%   BMI 28.10 kg/m     Wt Readings from Last 3 Encounters:  01/21/21 179 lb 6.4 oz (81.4 kg)  11/16/20 180 lb (81.6 kg)  11/01/20 177 lb (80.3 kg)     GEN: Well nourished, well developed in no acute distress HEENT: Normal NECK: No JVD; No  carotid bruits LYMPHATICS: No lymphadenopathy CARDIAC: S1S2 noted,RRR, no murmurs, rubs, gallops RESPIRATORY:  Clear to auscultation without rales, wheezing or rhonchi  ABDOMEN: Soft, non-tender, non-distended, +bowel sounds, no guarding. EXTREMITIES: No edema, No cyanosis, no clubbing MUSCULOSKELETAL:  No deformity  SKIN: Warm and dry NEUROLOGIC:  Alert and oriented x 3, non-focal PSYCHIATRIC:  Normal affect, good insight  ASSESSMENT:    1. Medication management   2. Mixed hyperlipidemia   3. Coronary artery disease involving native coronary artery of native heart, unspecified whether angina present   4. Hypertension, unspecified type   5. Smoker    PLAN:  No anginal symptoms.  She is concerned that she has had some intermittent palpitations.  Continue her current medication regimen which includes aspirin 81 mg daily as well as atorvastatin [place] to repeat her lipid profile to make sure that her LDL goal is less than 70.  I have educated the patient that quitting smoking will also help with decreasing her risk for worsening progression.  I have also asked the patient let me know if she experiences any chest pain history of low chestful to start the patient on antianginals.   Blood pressure is acceptable, continue with current antihypertensive regimen.  Smoking cessation advised.  The patient is in agreement with the above plan. The patient left the office in stable condition.  The patient will follow up in 9 months or sooner if needed.   Medication Adjustments/Labs and Tests Ordered: Current medicines are reviewed at length with the patient today.  Concerns regarding medicines are outlined above.  Orders Placed This Encounter  Procedures   Basic Metabolic Panel (BMET)   Magnesium   TSH+T4F+T3Free   Lipoprotein A (LPA)   Lipid panel   No orders of the defined types were placed in this encounter.   Patient Instructions  Medication Instructions:  Your physician  recommends that you continue on your current medications as directed. Please refer to the Current Medication list given to you today.  *If you need a refill on your cardiac medications before your next appointment, please call your pharmacy*   Lab Work: Your physician recommends that you return for lab work in:  TODAY: Lipids, BMET, Mag, Tsh+T3+T4, Lp(a), HbgA1C If you have labs (blood work) drawn today and your tests are completely normal, you will receive your results only by: [place] (if you have [place]) OR A paper copy in the mail If you have any lab test that is abnormal or we need to change your treatment, we will call you to review the results.   Testing/Procedures: None   Follow-Up: At [facility], you and your health needs are our priority.  As part of our continuing mission to provide you with exceptional heart care, we have created designated Provider Care Teams.  These Care Teams include your primary Cardiologist (physician) and Advanced Practice Providers (APPs -  Physician Assistants and Nurse Practitioners) who all work together to provide you with the care you need, when you need it.  We recommend signing up for the patient portal called "MyChart".  Sign up information is provided on this After Visit Summary.  MyChart is used to connect with patients for Virtual Visits (Telemedicine).  Patients are able to view lab/test results, encounter notes, upcoming appointments, etc.  Non-urgent messages can be sent to your provider as well.   To learn more about what you can do with MyChart, go to [email or web address].    Your next appointment:   9 month(s)  The format for your next appointment:   In Person  Provider:   Berniece Salines, DO     Other Instructions     Adopting a Healthy Lifestyle.  Know what a healthy weight is for you (roughly BMI <25) and aim to maintain this   Aim for 7+ servings of fruits and vegetables daily   65-80+ fluid ounces of water  or unsweet tea for healthy kidneys   Limit to max 1 drink of alcohol per day; avoid smoking/tobacco   Limit animal fats in diet for cholesterol and heart health - choose grass fed whenever available   Avoid  highly processed foods, and foods high in saturated/trans fats   Aim for low stress - take time to unwind and care for your mental health   Aim for 150 min of moderate intensity exercise weekly for heart health, and weights twice weekly for bone health   Aim for 7-9 hours of sleep daily   When it comes to diets, agreement about the perfect plan isnt easy to find, even among the experts. Experts at the [employer] developed an idea known as the Healthy Eating Plate. Just imagine a plate divided into logical, healthy portions.   The emphasis is on diet quality:   Load up on vegetables and fruits - one-half of your plate: Aim for color and variety, and remember that potatoes dont count.   Go for whole grains - one-quarter of your plate: Whole wheat, barley, wheat berries, quinoa, oats, brown rice, and foods made with them. If you want pasta, go with whole wheat pasta.   Protein power - one-quarter of your plate: Fish, chicken, beans, and nuts are all healthy, versatile protein sources. Limit red meat.   The diet, however, does go beyond the plate, offering a few other suggestions.   Use healthy plant oils, such as olive, canola, soy, corn, sunflower and peanut. Check the labels, and avoid partially hydrogenated oil, which have unhealthy trans fats.   If youre thirsty, drink water. Coffee and tea are good in moderation, but skip sugary drinks and limit milk and dairy products to one or two daily servings.   The type of carbohydrate in the diet is more important than the amount. Some sources of carbohydrates, such as vegetables, fruits, whole grains, and beans-are healthier than others.   Finally, stay active  Signed, Berniece Salines, DO  01/21/2021 8:09 PM     [facility]

## 2021-01-21 NOTE — Patient Instructions (Signed)
Medication Instructions:  Your physician recommends that you continue on your current medications as directed. Please refer to the Current Medication list given to you today.  *If you need a refill on your cardiac medications before your next appointment, please call your pharmacy*   Lab Work: Your physician recommends that you return for lab work in:  TODAY: Lipids, BMET, Mag, Tsh+T3+T4, Lp(a), HbgA1C If you have labs (blood work) drawn today and your tests are completely normal, you will receive your results only by: [place] (if you have [place]) OR A paper copy in the mail If you have any lab test that is abnormal or we need to change your treatment, we will call you to review the results.   Testing/Procedures: None   Follow-Up: At [facility], you and your health needs are our priority.  As part of our continuing mission to provide you with exceptional heart care, we have created designated Provider Care Teams.  These Care Teams include your primary Cardiologist (physician) and Advanced Practice Providers (APPs -  Physician Assistants and Nurse Practitioners) who all work together to provide you with the care you need, when you need it.  We recommend signing up for the patient portal called "MyChart".  Sign up information is provided on this After Visit Summary.  MyChart is used to connect with patients for Virtual Visits (Telemedicine).  Patients are able to view lab/test results, encounter notes, upcoming appointments, etc.  Non-urgent messages can be sent to your provider as well.   To learn more about what you can do with MyChart, go to [email or web address].    Your next appointment:   9 month(s)  The format for your next appointment:   In Person  Provider:   Berniece Salines, DO     Other Instructions

## 2021-01-22 LAB — LIPID PANEL
Chol/HDL Ratio: 2.9 ratio (ref 0.0–4.4)
Cholesterol, Total: 138 mg/dL (ref 100–199)
HDL: 48 mg/dL (ref >39)
LDL Chol Calc (NIH): 68 mg/dL (ref 0–99)
Triglycerides: 126 mg/dL (ref 0–149)
VLDL Cholesterol Cal: 22 mg/dL (ref 5–40)

## 2021-01-22 LAB — TSH+T4F+T3FREE
Free T4: 1.28 ng/dL (ref 0.82–1.77)
T3, Free: 3.2 pg/mL (ref 2.0–4.4)
TSH: 1.18 u[IU]/mL (ref 0.450–4.500)

## 2021-01-22 LAB — BASIC METABOLIC PANEL
BUN/Creatinine Ratio: 20 (ref 12–28)
BUN: 18 mg/dL (ref 8–27)
CO2: 22 mmol/L (ref 20–29)
Calcium: 10 mg/dL (ref 8.7–10.3)
Chloride: 108 mmol/L — ABNORMAL HIGH (ref 96–106)
Creatinine, Ser: 0.92 mg/dL (ref 0.57–1.00)
Glucose: 83 mg/dL (ref 70–99)
Potassium: 4.2 mmol/L (ref 3.5–5.2)
Sodium: 143 mmol/L (ref 134–144)
eGFR: 69 mL/min/{1.73_m2} (ref >59)

## 2021-01-22 LAB — MAGNESIUM: Magnesium: 2.7 mg/dL — ABNORMAL HIGH (ref 1.6–2.3)

## 2021-01-22 LAB — LIPOPROTEIN A (LPA): Lipoprotein (a): <8.4 nmol/L (ref <75.0)

## 2021-04-19 DIAGNOSIS — Z818 Family history of other mental and behavioral disorders: Secondary | ICD-10-CM | POA: Insufficient documentation

## 2021-10-27 ENCOUNTER — Ambulatory Visit (INDEPENDENT_AMBULATORY_CARE_PROVIDER_SITE_OTHER): Payer: Medicare Other | Admitting: Cardiology

## 2021-10-27 ENCOUNTER — Encounter: Payer: Self-pay | Admitting: Cardiology

## 2021-10-27 VITALS — BP 110/70 | HR 60 | Resp 20 | Ht 66.0 in | Wt 181.2 lb

## 2021-10-27 DIAGNOSIS — I714 Abdominal aortic aneurysm, without rupture, unspecified: Secondary | ICD-10-CM | POA: Diagnosis not present

## 2021-10-27 DIAGNOSIS — R079 Chest pain, unspecified: Secondary | ICD-10-CM | POA: Diagnosis not present

## 2021-10-27 MED ORDER — ISOSORBIDE MONONITRATE ER 30 MG PO TB24: 30.0000 mg | ORAL_TABLET | Freq: Every day | ORAL | 3 refills | Status: DC

## 2021-10-27 NOTE — Patient Instructions (Signed)
Medication Instructions:  Your physician has recommended you make the following change in your medication:  START: Imdur 30 mg once daily *If you need a refill on your cardiac medications before your next appointment, please call your pharmacy*   Lab Work: None If you have labs (blood work) drawn today and your tests are completely normal, you will receive your results only by: [place] (if you have MyChart) OR A paper copy in the mail If you have any lab test that is abnormal or we need to change your treatment, we will call you to review the results.   Testing/Procedures: Your physician has requested that you have an abdominal aorta duplex. During this test, an ultrasound is used to evaluate the aorta. Allow 30 minutes for this exam. Do not eat after midnight the day before and avoid carbonated beverages  Follow-Up: At [facility], you and your health needs are our priority.  As part of our continuing mission to provide you with exceptional heart care, we have created designated Provider Care Teams.  These Care Teams include your primary Cardiologist (physician) and Advanced Practice Providers (APPs -  Physician Assistants and Nurse Practitioners) who all work together to provide you with the care you need, when you need it.  We recommend signing up for the patient portal called "MyChart".  Sign up information is provided on this After Visit Summary.  MyChart is used to connect with patients for Virtual Visits (Telemedicine).  Patients are able to view lab/test results, encounter notes, upcoming appointments, etc.  Non-urgent messages can be sent to your provider as well.   To learn more about what you can do with MyChart, go to [email or web address].    Your next appointment:   6 month(s)  The format for your next appointment:   In Person  Provider:   Berniece Salines, DO     Other Instructions   Important Information About Sugar

## 2021-10-27 NOTE — Progress Notes (Signed)
Cardiology Office Note:    Date:  10/27/2021   ID:  Cabrini, Ruggieri 11/11/1955, MRN [id number]  PCP:  Rich Fuchs, PA  Cardiologist:  Berniece Salines, DO  Electrophysiologist:  None   Referring MD: Rich Fuchs, PA   " I am having chest pain"   History of Present Illness:    [name] is a 66 y.o. 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  She is here today for follow-up visit.  When I saw the patient in August 2022 at that time she was experiencing chest discomfort I recommend she undergo a left heart catheterization given her high risk factors for coronary artery disease.  I saw the patient in November 2022 at that time she was not experiencing any symptoms.  She had had intermittent palpitations.  We will continue on the aspirin as well as her atorvastatin.  Blood pressure was within normal.  Since I saw the patient she has had intermittent chest discomfort.  She notes she also has had some significant acid reflux she recently been started on omeprazole.  Past Medical History:  Diagnosis Date   AAA (abdominal aortic aneurysm) (HCC)    Cognitive change    SVT (supraventricular tachycardia) ([place])     Past Surgical History:  Procedure Laterality Date   ABLATION     3times   APPENDECTOMY     LEFT HEART CATH AND CORONARY ANGIOGRAPHY N/A 11/01/2020   Procedure: LEFT HEART CATH AND CORONARY ANGIOGRAPHY;  Surgeon: Burnell Blanks, MD;  Location: [address] [facility];  Service: Cardiovascular;  Laterality: N/A;   MASTECTOMY PARTIAL / LUMPECTOMY Left    Multiple fx repaired     TOTAL ABDOMINAL HYSTERECTOMY      Current Medications: Current Meds  Medication Sig    acetaminophen (TYLENOL) 500 MG tablet Take 1,000-1,500 mg by mouth at bedtime as needed for moderate pain.   aspirin EC 81 MG tablet Take 81 mg by mouth every other day. Swallow whole.   atorvastatin (LIPITOR) 20 MG tablet Take 20 mg by mouth daily.   calcium carbonate (TUMS - DOSED IN MG ELEMENTAL CALCIUM) 500 MG chewable tablet Chew 1,000 mg by mouth daily as needed for indigestion or heartburn.   gabapentin (NEURONTIN) 300 MG capsule 1 tab by mouth in the evening for 1 week, then 1 tab twice a day for 1 week, then 1 tab three times a day   isosorbide mononitrate (IMDUR) 30 MG 24 hr tablet Take 1 tablet (30 mg total) by mouth daily.   Liniments (BLUE-EMU SUPER STRENGTH EX) Apply 1 application topically daily as needed (pain).   metoprolol tartrate (LOPRESSOR) 25 MG tablet Take by mouth.   omeprazole (PRILOSEC) 40 MG capsule Take by mouth.   topiramate (TOPAMAX) 100 MG tablet Take 200 mg by mouth 2 (two) times daily.     Allergies:   Latex, Other, Codeine, and Tape   Social History   Socioeconomic History   Marital status: Married    Spouse name: Not on file   Number of children: Not on file   Years of education: Not on file   Highest education level: Not on file  Occupational History   Not on file  Tobacco Use   Smoking  status: Every Day    Types: Cigarettes   Smokeless tobacco: Never  Substance and Sexual Activity   Alcohol use: Not Currently   Drug use: Not Currently   Sexual activity: Not Currently  Other Topics Concern   Not on file  Social History Narrative   Not on file   Social Determinants of Health   Financial Resource Strain: Not on file  Food Insecurity: Not on file  Transportation Needs: Not on file  Physical Activity: Not on file  Stress: Not on file  Social Connections: Not on file     Family History: The patient's family history is not on file.  ROS:   Review of Systems  Constitution: Negative for decreased appetite, fever and weight gain.  HENT:  Negative for congestion, ear discharge, hoarse voice and sore throat.   Eyes: Negative for discharge, redness, vision loss in right eye and visual halos.  Cardiovascular: Negative for chest pain, dyspnea on exertion, leg swelling, orthopnea and palpitations.  Respiratory: Negative for cough, hemoptysis, shortness of breath and snoring.   Endocrine: Negative for heat intolerance and polyphagia.  Hematologic/Lymphatic: Negative for bleeding problem. Does not bruise/bleed easily.  Skin: Negative for flushing, nail changes, rash and suspicious lesions.  Musculoskeletal: Negative for arthritis, joint pain, muscle cramps, myalgias, neck pain and stiffness.  Gastrointestinal: Negative for abdominal pain, bowel incontinence, diarrhea and excessive appetite.  Genitourinary: Negative for decreased libido, genital sores and incomplete emptying.  Neurological: Negative for brief paralysis, focal weakness, headaches and loss of balance.  Psychiatric/Behavioral: Negative for altered mental status, depression and suicidal ideas.  Allergic/Immunologic: Negative for HIV exposure and persistent infections.    EKGs/Labs/Other Studies Reviewed:    The following studies were reviewed today:   EKG:  The ekg ordered today demonstrates sinus rhythm, heart rate 60 bpm.  Tte 11/08/2020 IMPRESSIONS   1. Vertical orientation of the LV . Left ventricular ejection fraction,  by estimation, is 55 to 60%. The left ventricle has normal function. The  left ventricle has no regional wall motion abnormalities. There is mild  concentric left ventricular  hypertrophy. Left ventricular diastolic parameters are consistent with  Grade I diastolic dysfunction (impaired relaxation).   2. Right ventricular systolic function is normal. The right ventricular  size is normal. There is normal pulmonary artery systolic pressure.   3. The mitral valve is degenerative. No evidence of mitral valve  regurgitation. No evidence of mitral  stenosis.   4. The aortic valve is normal in structure. Aortic valve regurgitation is  mild. Mild aortic valve sclerosis is present, with no evidence of aortic  valve stenosis.   5. The inferior vena cava is normal in size with greater than 50%  respiratory variability, suggesting right atrial pressure of 3 mmHg.   FINDINGS   Left Ventricle: Vertical orientation of the LV. Left ventricular ejection  fraction, by estimation, is 55 to 60%. The left ventricle has normal  function. The left ventricle has no regional wall motion abnormalities.  Global longitudinal strain performed  but not reported based on interpreter judgement due to suboptimal  tracking. The left ventricular internal cavity size was normal in size.  There is mild concentric left ventricular hypertrophy. Left ventricular  diastolic parameters are consistent with  Grade I diastolic dysfunction (impaired relaxation). Normal left  ventricular filling pressure.   Right Ventricle: The right ventricular size is normal. No increase in  right ventricular wall thickness. Right ventricular systolic function is  normal. There is normal pulmonary artery systolic  pressure. The tricuspid  regurgitant velocity is 2.38 m/s, and   with an assumed right atrial pressure of 3 mmHg, the estimated right  ventricular systolic pressure is 58.0 mmHg.   Left Atrium: Left atrial size was normal in size.   Right Atrium: Right atrial size was normal in size.   Pericardium: There is no evidence of pericardial effusion.   Mitral Valve: The mitral valve is degenerative in appearance. Mild mitral  annular calcification. No evidence of mitral valve regurgitation. No  evidence of mitral valve stenosis.   Tricuspid Valve: The tricuspid valve is normal in structure. Tricuspid  valve regurgitation is trivial. No evidence of tricuspid stenosis.   Aortic Valve: The aortic valve is normal in structure. Aortic valve  regurgitation is mild. Aortic  regurgitation PHT measures 533 msec. Mild  aortic valve sclerosis is present, with no evidence of aortic valve  stenosis.   Pulmonic Valve: The pulmonic valve was normal in structure. Pulmonic valve  regurgitation is not visualized. No evidence of pulmonic stenosis.   Aorta: The aortic root and ascending aorta are structurally normal, with  no evidence of dilitation and the aortic arch was not well visualized.   Venous: The pulmonary veins were not well visualized. The inferior vena  cava is normal in size with greater than 50% respiratory variability,  suggesting right atrial pressure of 3 mmHg.   IAS/Shunts: No atrial level shunt detected by color flow Doppler.    LHC 11/01/2020  Prox RCA lesion is 30% stenosed.   Mid RCA lesion is 20% stenosed.   2nd Mrg lesion is 20% stenosed.   Mid LAD lesion is 100% stenosed.   There is mild left ventricular systolic dysfunction.   LV end diastolic pressure is normal.   The left ventricular ejection fraction is 45-50% by visual estimate.   There is no mitral valve regurgitation.   The LAD is a large caliber vessel that courses to the apex. The LAD is occluded beyond the takeoff of the large diagonal branch. The LAD is diffusely diseased beyond the chronic occlusion and fills from left to left collaterals.  Mild plaque in the Circumflex The RCA is a large dominant vessel with mild aneurysmal dilation of the proximal vessel, mild plaque.  LVEF 45% with mild anterior wall motion abnormality  Recommendations: Medical management of CAD. The LAD is not a favorable target for PCI. The vessel fills from collaterals. Consider anti-anginal therapy. Echo pending  Recent Labs: 10/27/2020: Hemoglobin 16.6; Platelets 204 01/21/2021: BUN 18; Creatinine, Ser 0.92; Magnesium 2.7; Potassium 4.2; Sodium 143; TSH 1.180  Recent Lipid Panel    Component Value Date/Time   CHOL 138 01/21/2021 1113   TRIG 126 01/21/2021 1113   HDL 48 01/21/2021 1113   CHOLHDL 2.9  01/21/2021 1113   CHOLHDL 5.8 CALC 10/21/2007 0849   VLDL 33 10/21/2007 0849   LDLCALC 68 01/21/2021 1113    Physical Exam:    VS:  BP 110/70   Pulse 60   Resp 20   Ht '5\' 6"'$  (1.676 m)   Wt 181 lb 3.2 oz (82.2 kg)   SpO2 94%   BMI 29.25 kg/m     Wt Readings from Last 3 Encounters:  10/27/21 181 lb 3.2 oz (82.2 kg)  01/21/21 179 lb 6.4 oz (81.4 kg)  11/16/20 180 lb (81.6 kg)     GEN: Well nourished, well developed in no acute distress HEENT: Normal NECK: No JVD; No carotid bruits LYMPHATICS: No lymphadenopathy CARDIAC: S1S2 noted,RRR, no  murmurs, rubs, gallops RESPIRATORY:  Clear to auscultation without rales, wheezing or rhonchi  ABDOMEN: Soft, non-tender, non-distended, +bowel sounds, no guarding. EXTREMITIES: No edema, No cyanosis, no clubbing MUSCULOSKELETAL:  No deformity  SKIN: Warm and dry NEUROLOGIC:  Alert and oriented x 3, non-focal PSYCHIATRIC:  Normal affect, good insight  ASSESSMENT:    1. Abdominal aortic aneurysm (AAA) without rupture, unspecified part ([place])   2. Chest pain of uncertain etiology    PLAN:    Coronary artery disease-she has been experiencing some intermittent chest discomfort which I am concerned could be likely stable angina.  I like to start the patient on Imdur 30 mg daily.  She has had some headaches on sublingual nitroglycerin, we have agreed to do a trial of this should she continue to persist with headaches I will transition her to Ranexa.  Abdominal aortic aneurysm-we will repeat imaging with ultrasound of the abdomen for surveillance.  Blood pressure is acceptable, continue with current antihypertensive regimen.  Smoking cessation advised.  The patient is in agreement with the above plan. The patient left the office in stable condition.  The patient will follow up in 6 months   Medication Adjustments/Labs and Tests Ordered: Current medicines are reviewed at length with the patient today.  Concerns regarding medicines are  outlined above.  Orders Placed This Encounter  Procedures   EKG 12-Lead   VAS [place] AAA DUPLEX   Meds ordered this encounter  Medications   isosorbide mononitrate (IMDUR) 30 MG 24 hr tablet    Sig: Take 1 tablet (30 mg total) by mouth daily.    Dispense:  90 tablet    Refill:  3    Patient Instructions  Medication Instructions:  Your physician has recommended you make the following change in your medication:  START: Imdur 30 mg once daily *If you need a refill on your cardiac medications before your next appointment, please call your pharmacy*   Lab Work: None If you have labs (blood work) drawn today and your tests are completely normal, you will receive your results only by: [place] (if you have MyChart) OR A paper copy in the mail If you have any lab test that is abnormal or we need to change your treatment, we will call you to review the results.   Testing/Procedures: Your physician has requested that you have an abdominal aorta duplex. During this test, an ultrasound is used to evaluate the aorta. Allow 30 minutes for this exam. Do not eat after midnight the day before and avoid carbonated beverages  Follow-Up: At [facility], you and your health needs are our priority.  As part of our continuing mission to provide you with exceptional heart care, we have created designated Provider Care Teams.  These Care Teams include your primary Cardiologist (physician) and Advanced Practice Providers (APPs -  Physician Assistants and Nurse Practitioners) who all work together to provide you with the care you need, when you need it.  We recommend signing up for the patient portal called "MyChart".  Sign up information is provided on this After Visit Summary.  MyChart is used to connect with patients for Virtual Visits (Telemedicine).  Patients are able to view lab/test results, encounter notes, upcoming appointments, etc.  Non-urgent messages can be sent to your provider as well.    To learn more about what you can do with MyChart, go to [email or web address].    Your next appointment:   6 month(s)  The format for your next appointment:  In Person  Provider:   Berniece Salines, DO     Other Instructions   Important Information About Sugar         Adopting a Healthy Lifestyle.  Know what a healthy weight is for you (roughly BMI <25) and aim to maintain this   Aim for 7+ servings of fruits and vegetables daily   65-80+ fluid ounces of water or unsweet tea for healthy kidneys   Limit to max 1 drink of alcohol per day; avoid smoking/tobacco   Limit animal fats in diet for cholesterol and heart health - choose grass fed whenever available   Avoid highly processed foods, and foods high in saturated/trans fats   Aim for low stress - take time to unwind and care for your mental health   Aim for 150 min of moderate intensity exercise weekly for heart health, and weights twice weekly for bone health   Aim for 7-9 hours of sleep daily   When it comes to diets, agreement about the perfect plan isnt easy to find, even among the experts. Experts at the [employer] developed an idea known as the Healthy Eating Plate. Just imagine a plate divided into logical, healthy portions.   The emphasis is on diet quality:   Load up on vegetables and fruits - one-half of your plate: Aim for color and variety, and remember that potatoes dont count.   Go for whole grains - one-quarter of your plate: Whole wheat, barley, wheat berries, quinoa, oats, brown rice, and foods made with them. If you want pasta, go with whole wheat pasta.   Protein power - one-quarter of your plate: Fish, chicken, beans, and nuts are all healthy, versatile protein sources. Limit red meat.   The diet, however, does go beyond the plate, offering a few other suggestions.   Use healthy plant oils, such as olive, canola, soy, corn, sunflower and peanut. Check the labels, and  avoid partially hydrogenated oil, which have unhealthy trans fats.   If youre thirsty, drink water. Coffee and tea are good in moderation, but skip sugary drinks and limit milk and dairy products to one or two daily servings.   The type of carbohydrate in the diet is more important than the amount. Some sources of carbohydrates, such as vegetables, fruits, whole grains, and beans-are healthier than others.   Finally, stay active  Signed, Berniece Salines, DO  10/27/2021 8:40 AM    [place]

## 2021-11-04 LAB — HM DEXA SCAN

## 2021-11-04 LAB — HM MAMMOGRAPHY

## 2021-11-06 DIAGNOSIS — M810 Age-related osteoporosis without current pathological fracture: Secondary | ICD-10-CM

## 2021-11-06 HISTORY — DX: Age-related osteoporosis without current pathological fracture: M81.0

## 2021-11-09 ENCOUNTER — Ambulatory Visit (HOSPITAL_COMMUNITY)
Admission: RE | Admit: 2021-11-09 | Discharge: 2021-11-09 | Disposition: A | Discharge status: Home / Self Care | Payer: Medicare Other | Source: Ambulatory Visit | Attending: Internal Medicine | Admitting: Internal Medicine

## 2021-11-09 DIAGNOSIS — I714 Abdominal aortic aneurysm, without rupture, unspecified: Secondary | ICD-10-CM | POA: Diagnosis present

## 2021-11-09 DIAGNOSIS — I7143 Infrarenal abdominal aortic aneurysm, without rupture: Secondary | ICD-10-CM

## 2021-11-10 ENCOUNTER — Emergency Department (HOSPITAL_COMMUNITY): Payer: Medicare Other

## 2021-11-10 ENCOUNTER — Other Ambulatory Visit: Payer: Self-pay

## 2021-11-10 ENCOUNTER — Emergency Department (HOSPITAL_COMMUNITY)
Admission: EM | Admit: 2021-11-10 | Discharge: 2021-11-10 | Disposition: A | Discharge status: Home / Self Care | Payer: Medicare Other | Attending: Emergency Medicine | Admitting: Emergency Medicine

## 2021-11-10 ENCOUNTER — Encounter (HOSPITAL_COMMUNITY): Payer: Self-pay

## 2021-11-10 DIAGNOSIS — R4182 Altered mental status, unspecified: Secondary | ICD-10-CM | POA: Diagnosis not present

## 2021-11-10 DIAGNOSIS — R519 Headache, unspecified: Secondary | ICD-10-CM | POA: Insufficient documentation

## 2021-11-10 LAB — CBC
HCT: 48 % — ABNORMAL HIGH (ref 36.0–46.0)
Hemoglobin: 15.6 g/dL — ABNORMAL HIGH (ref 12.0–15.0)
MCH: 31.5 pg (ref 26.0–34.0)
MCHC: 32.5 g/dL (ref 30.0–36.0)
MCV: 97 fL (ref 80.0–100.0)
Platelets: 168 10*3/uL (ref 150–400)
RBC: 4.95 MIL/uL (ref 3.87–5.11)
RDW: 13 % (ref 11.5–15.5)
WBC: 8.4 10*3/uL (ref 4.0–10.5)
nRBC: 0 % (ref 0.0–0.2)

## 2021-11-10 LAB — COMPREHENSIVE METABOLIC PANEL
ALT: 19 U/L (ref 0–44)
AST: 29 U/L (ref 15–41)
Albumin: 4.1 g/dL (ref 3.5–5.0)
Alkaline Phosphatase: 62 U/L (ref 38–126)
Anion gap: 9 (ref 5–15)
BUN: 17 mg/dL (ref 8–23)
CO2: 18 mmol/L — ABNORMAL LOW (ref 22–32)
Calcium: 10.2 mg/dL (ref 8.9–10.3)
Chloride: 114 mmol/L — ABNORMAL HIGH (ref 98–111)
Creatinine, Ser: 0.95 mg/dL (ref 0.44–1.00)
GFR, Estimated: >60 mL/min (ref >60)
Glucose, Bld: 74 mg/dL (ref 70–99)
Potassium: 5.1 mmol/L (ref 3.5–5.1)
Sodium: 141 mmol/L (ref 135–145)
Total Bilirubin: 0.8 mg/dL (ref 0.3–1.2)
Total Protein: 6.8 g/dL (ref 6.5–8.1)

## 2021-11-10 LAB — TROPONIN I (HIGH SENSITIVITY)
Troponin I (High Sensitivity): 7 ng/L (ref <18)
Troponin I (High Sensitivity): 6 ng/L (ref <18)

## 2021-11-10 LAB — LIPASE, BLOOD: Lipase: 34 U/L (ref 11–51)

## 2021-11-10 MED ORDER — ACETAMINOPHEN 500 MG PO TABS: 1000.0000 mg | ORAL_TABLET | Freq: Once | ORAL | Status: DC

## 2021-11-10 MED ORDER — LACTATED RINGERS IV BOLUS
1000.0000 mL | Freq: Once | INTRAVENOUS | Status: AC
  Administered 2021-11-10: 1000 mL via INTRAVENOUS

## 2021-11-10 NOTE — ED Notes (Signed)
Patient transported to CT 

## 2021-11-10 NOTE — ED Provider Notes (Signed)
[facility] EMERGENCY DEPARTMENT Provider Note   CSN: [id number] Arrival date & time: 11/10/21  1512     History Chief Complaint  Patient presents with   Headache    HPI [name] is a 66 y.o. female presenting for chief complaint of headache.  Patient states that she has had 1 day of headache and altered mental status intermittently.  She denies fevers or chills nausea vomiting syncope shortness of breath.  She has a history of SVT and endorses frequent palpitations lately.  Currently she is without headache and states that she would just like to go home. She otherwise ambulatory tolerating p.o. intake.  No known sick contacts.  Patient's significant other is at bedside and states that she has returned to her baseline but was very confused earlier in the day.  She was post to go to a dentist appointment. She has multiple comorbid medical conditions and is on multiple medications.  She has been compliant with all of her medications.  Patient's recorded medical, surgical, social, medication list and allergies were reviewed in the Snapshot window as part of the initial history.   Review of Systems   Review of Systems  Constitutional:  Negative for chills and fever.  HENT:  Negative for ear pain and sore throat.   Eyes:  Negative for pain and visual disturbance.  Respiratory:  Negative for cough and shortness of breath.   Cardiovascular:  Negative for chest pain and palpitations.  Gastrointestinal:  Negative for abdominal pain and vomiting.  Genitourinary:  Negative for dysuria and hematuria.  Musculoskeletal:  Negative for arthralgias and back pain.  Skin:  Negative for color change and rash.  Neurological:  Positive for light-headedness. Negative for seizures and syncope.  Psychiatric/Behavioral:  Positive for confusion.   All other systems reviewed and are negative.   Physical Exam Updated Vital Signs BP 138/82   Pulse (!) 58   Temp 98.5 F (36.9  C) (Oral)   Resp 18   Ht '5\' 6"'$  (1.676 m)   Wt 82.1 kg   SpO2 99%   BMI 29.21 kg/m  Physical Exam Vitals and nursing note reviewed.  Constitutional:      General: She is not in acute distress.    Appearance: She is well-developed.  HENT:     Head: Normocephalic and atraumatic.  Eyes:     Conjunctiva/sclera: Conjunctivae normal.  Cardiovascular:     Rate and Rhythm: Normal rate and regular rhythm.     Heart sounds: No murmur heard. Pulmonary:     Effort: Pulmonary effort is normal. No respiratory distress.     Breath sounds: Normal breath sounds.  Abdominal:     Palpations: Abdomen is soft.     Tenderness: There is no abdominal tenderness.  Musculoskeletal:        General: No swelling.     Cervical back: Neck supple.  Skin:    General: Skin is warm and dry.     Capillary Refill: Capillary refill takes less than 2 seconds.  Neurological:     Mental Status: She is alert.  Psychiatric:        Mood and Affect: Mood normal.      ED Course/ Medical Decision Making/ A&P Clinical Course as of 11/10/21 2359  Thu Nov 10, 2021  2017 Labs and head CT then reassess [CC]    Clinical Course User Index [CC] Tretha Sciara, MD    Procedures Procedures  Medical Decision Making:   [name]  is a 66 y.o. female who presented to the ED today with headache detailed above.    Patient's presentation is complicated by their history of chronic migraines on complex OP medication regimens.  Patient placed on continuous vitals and telemetry monitoring while in ED which was reviewed periodically.   Complete initial physical exam performed, notably the patient  was HDS.    Reviewed and confirmed nursing documentation for past medical history, family history, social history.    Initial Assessment:   With the patient's presentation of headache, most likely diagnosis is tension type headaches vs atypical migraines. Other diagnoses were considered including (but not limited to)  intracranial mass, intracranial hemorrhage, intracranial infection including meningitis vs encephalitis, GCA, trigeminal neuralgia. These are considered less likely due to history of present illness and physical exam findings.    This is most consistent with an acute life/limb threatening illness complicated by underlying chronic conditions.   Timeline and slow onset is not consistent with SAH/ICH   Age and description of pain is not consistent with GCA   Lack of fever,meningismus is not consistent with meningitis/encephalitis   Initial Plan:  Will initiate treatment with Tylenol for treatment of nonspecific headache  Screening labs including CBC and Metabolic panel to evaluate for infectious or metabolic etiology of disease.  Urinalysis with reflex culture ordered to evaluate for UTI or relevant urologic/nephrologic pathology.  CTH to evaluate for structural IC etiology  Objective evaluation as below reviewed   Initial Study Results:   Laboratory  All laboratory results reviewed without evidence of clinically relevant pathology.   Patient unable to provide urine sample.  She states she accidentally went to the bathroom without providing urine sample.  EKG EKG was reviewed independently. Rate, rhythm, axis, intervals all examined and without medically relevant abnormality. ST segments without concerns for elevations.    Radiology:  All images reviewed independently. Agree with radiology report at this time.   CT HEAD WO CONTRAST (5MM)  Final Result    DG Chest Portable 1 View  Final Result      Final Assessment and Plan:   Reassessed patient after 8 hours in the emergency department.  She remains overall well-appearing.  She states that she feels fine and would like to be discharged.  Labs have a mild CO2 decrease that may be indicative of dehydration though it is nonspecific.  Additionally no other focal pathology appreciated.  CT scan with no focal pathology x-ray without focal  pathology and EKG grossly reassuring.  She is intermittently confused though family states they are working her up for dementia in the outpatient setting. I discussed with family and patient, after extensive evaluation and overall well appearance at this time, they are requesting discharge with plan for outpatient follow-up with PCP.  Given that symptoms are controlled and patient is well-appearing, I believe this is reasonable.  Patient discharged with no further acute events.  Clinical Impression:  1. Nonintractable headache, unspecified chronicity pattern, unspecified headache type      Discharge   Clinical Impression:  1. Nonintractable headache, unspecified chronicity pattern, unspecified headache type      Discharge   Final Clinical Impression(s) / ED Diagnoses Final diagnoses:  Nonintractable headache, unspecified chronicity pattern, unspecified headache type    Rx / DC Orders ED Discharge Orders     None         Tretha Sciara, MD 11/10/21 2359

## 2021-11-10 NOTE — ED Triage Notes (Signed)
Apparently patient was difficulty to arouse for her dentist appt and supposedly woke up with severe headache, photosensitivity and husband brought patient to ER.  Patient continues to state "I was made to come here".  Patient complains of left side numbness, neck pain.  Keeps stating " ijust want to go home and got to bed".

## 2021-11-10 NOTE — ED Notes (Signed)
Pt back in lobby.

## 2021-11-10 NOTE — ED Notes (Signed)
Pt decided to leave not sure as to why being she told someone else she was leaving.

## 2021-11-10 NOTE — ED Provider Triage Note (Addendum)
Emergency Medicine Provider Triage Evaluation Note  [name] , a 66 y.o. female  was evaluated in triage.  Pt complains of headache starting last night. Hx of migraines and feels similar, takes topamax and amitryptiline. Took nyquil before going to bed. Was supposed to go to the dentist and was difficult to arouse per husband. Husband reports she was disoriented, but she doesn't remember this. On the way to the dentist she started feeling left sided neck and shoulder weakness, tingling of left arm and left leg.   Keeps stating "I just want to go home and go to bed"  Review of Systems  Positive: As above, photosensitivity Negative: Syncope  Physical Exam  BP (!) 162/89 (BP Location: Right Arm)   Pulse 74   Temp 98.5 F (36.9 C) (Oral)   Resp (!) 22   Ht '5\' 6"'$  (1.676 m)   Wt 82.1 kg   SpO2 100%   BMI 29.21 kg/m  Gen:   Awake, no distress   Resp:  Normal effort  MSK:   Moves extremities without difficulty  Other:  Tearful in triage  Medical Decision Making  Medically screening exam initiated at 3:43 PM.  Appropriate orders placed.  [name] was informed that the remainder of the evaluation will be completed by another provider, this initial triage assessment does not replace that evaluation, and the importance of remaining in the ED until their evaluation is complete.     Estill Cotta 11/10/21 1548  Says she "has a pink slip of paper that says if anything happens to her she doesn't want medical care or life saving measures." States her husband has this document.     Greco Gastelum T, PA-C 11/10/21 1552

## 2021-11-11 ENCOUNTER — Other Ambulatory Visit: Payer: Self-pay | Admitting: Cardiology

## 2021-11-21 DIAGNOSIS — Z87820 Personal history of traumatic brain injury: Secondary | ICD-10-CM | POA: Insufficient documentation

## 2021-11-29 ENCOUNTER — Telehealth: Payer: Self-pay | Admitting: Cardiology

## 2021-11-29 NOTE — Telephone Encounter (Signed)
Pt would like to ask Dr. Harriet Masson if its ok for her to wait until 10/18 to see her for ED f/u. She was in [facility] ED on 08/31 and was told to f/u with her. I did offer APP for sooner appt but she said she doesn't have transportation since her husband will be out of town. However, if Dr.Tobb recommends that she needs to be seen sooner then she can ask one of her kids to take a day off from work to take her.

## 2021-11-29 NOTE — Telephone Encounter (Signed)
Spoke to patient informed her it will be okay to see Dr Harriet Masson on 12/28/21. Patient states she has  seen the neurologist and primary. Both did not change any medication and treatment.   Patient has had very few twinges  chest discomfort since  ED  visit- they have not required any medication.  Patient does not feel comfortable in driving the distance to [place].  If she develop any further symptoms aware to call back.

## 2021-12-27 NOTE — Progress Notes (Deleted)
Cardiology Office Note:    Date:  12/27/2021   ID:  [name], DOB 1955-09-17, MRN [id number]  PCP:  Rich Fuchs, PA [facility] Cardiologist: Berniece Salines, DO   Reason for visit: Follow-up ED visit/chest pain  History of Present Illness:    [name] is a 66 y.o. female with a hx of CAD, SVT post ablation, tobacco use, hyperlipidemia, hypertension, history of left femoral right breast cancer with radiation lobectomy, abdominal aortic aneurysm and bilateral carotid atherosclerosis.  [facility] 2022 showed occluded LAD beyond the takeoff of a large diagonal branch, filled from left to left collaterals, left circumflex with mild plaque, dominant RCA with mild plaque.  Medical management of CAD pursued.  LAD was thought not not a favorable target for PCI.  She last saw Dr. Berniece Salines in October 27 2021.  Patient mention intermittent chest discomfort.  For possible stable angina, she was started on Imdur 30 mg daily.  She went to the ED on November 10, 2021 with headache that started after dentist appointment.  She has chronic migraines.  Today, ***  Precordial pain CAD -[facility] 2022 showed occluded LAD beyond the takeoff of a large diagonal branch, filled from left to left collaterals, left circumflex with mild plaque, dominant RCA with mild plaque.  Medical management of CAD pursued.   -***  Abdominal aortic aneurysm -Duplex 10/2021: Distal AAA diameter ~3cm -Tobacco cessation recommended  Hypertension -*** -Goal BP is <130/80.  Recommend DASH diet (high in vegetables, fruits, low-fat dairy products, whole grains, poultry, fish, and nuts and low in sweets, sugar-sweetened beverages, and red meats), salt restriction and increase physical activity.  Hyperlipidemia -*** -Discussed cholesterol lowering diets - Mediterranean diet, DASH diet, vegetarian diet, low-carbohydrate diet and avoidance of trans fats.  Discussed healthier choice substitutes.  Nuts, high-fiber foods, and  fiber supplements may also improve lipids.    Obesity -Discussed how even a 5-10% weight loss can have cardiovascular benefits.   -Recommend moderate intensity activity for 30 minutes 5 days/week and the DASH diet.  Tobacco use  -Recommend tobacco cessation.  Reviewed physiologic effects of nicotine and the immediate-eventual benefits of quitting including improvement in cough/breathing and reduction in cardiovascular events.  Discussed quitting tips such as removing triggers and getting support from family/friends and Quitline [place]. -USPSTF recommends one-time screening for abdominal aortic aneurysm (AAA) by ultrasound in men 61 -18 years old who have ever smoked.      Disposition - Follow-up in ***     Past Medical History:  Diagnosis Date   AAA (abdominal aortic aneurysm) (HCC)    Cognitive change    SVT (supraventricular tachycardia) ([facility])     Past Surgical History:  Procedure Laterality Date   ABLATION     3times   APPENDECTOMY     LEFT HEART CATH AND CORONARY ANGIOGRAPHY N/A 11/01/2020   Procedure: LEFT HEART CATH AND CORONARY ANGIOGRAPHY;  Surgeon: Burnell Blanks, MD;  Location: [facility];  Service: Cardiovascular;  Laterality: N/A;   MASTECTOMY PARTIAL / LUMPECTOMY Left    Multiple fx repaired     TOTAL ABDOMINAL HYSTERECTOMY      Current Medications: No outpatient medications have been marked as taking for the 12/28/21 encounter (Appointment) with Warren Lacy, PA-C.     Allergies:   Latex, Other, Codeine, and Tape   Social History   Socioeconomic History   Marital status: Married    Spouse name: Not on file   Number of children:  Not on file   Years of education: Not on file   Highest education level: Not on file  Occupational History   Not on file  Tobacco Use   Smoking status: Every Day    Types: Cigarettes   Smokeless tobacco: Never  Substance and Sexual Activity   Alcohol use: Not Currently   Drug use: Not Currently   Sexual  activity: Not Currently  Other Topics Concern   Not on file  Social History Narrative   Not on file   Social Determinants of Health   Financial Resource Strain: Not on file  Food Insecurity: Not on file  Transportation Needs: Not on file  Physical Activity: Not on file  Stress: Not on file  Social Connections: Not on file     Family History: The patient's family history is not on file.  ROS:   Please see the history of present illness.     EKGs/Labs/Other Studies Reviewed:    EKG:  The ekg ordered today demonstrates ***  Recent Labs: 01/21/2021: Magnesium 2.7; TSH 1.180 11/10/2021: ALT 19; BUN 17; Creatinine, Ser 0.95; Hemoglobin 15.6; Platelets 168; Potassium 5.1; Sodium 141   Recent Lipid Panel Lab Results  Component Value Date/Time   CHOL 138 01/21/2021 11:13 AM   TRIG 126 01/21/2021 11:13 AM   HDL 48 01/21/2021 11:13 AM   LDLCALC 68 01/21/2021 11:13 AM    Physical Exam:    VS:  There were no vitals taken for this visit.   No data found.  No BP recorded.  {Refresh Note OR Click here to enter BP  :1}***    Wt Readings from Last 3 Encounters:  11/10/21 181 lb (82.1 kg)  10/27/21 181 lb 3.2 oz (82.2 kg)  01/21/21 179 lb 6.4 oz (81.4 kg)     GEN: *** Well nourished, well developed in no acute distress HEENT: Normal NECK: No JVD; No carotid bruits CARDIAC: ***RRR, no murmurs, rubs, gallops RESPIRATORY:  Clear to auscultation without rales, wheezing or rhonchi  ABDOMEN: Soft, non-tender, non-distended MUSCULOSKELETAL: No edema SKIN: Warm and dry NEUROLOGIC:  Alert and oriented PSYCHIATRIC:  Normal affect     ASSESSMENT AND PLAN   ***   {Are you ordering a CV Procedure (e.g. stress test, cath, DCCV, TEE, etc)?   Press F2        :[id number]}    Medication Adjustments/Labs and Tests Ordered: Current medicines are reviewed at length with the patient today.  Concerns regarding medicines are outlined above.  No orders of the defined types were placed in  this encounter.  No orders of the defined types were placed in this encounter.   There are no Patient Instructions on file for this visit.   Signed, Warren Lacy, PA-C  12/27/2021 1:33 PM    [facility]

## 2021-12-28 ENCOUNTER — Ambulatory Visit: Payer: Medicare Other | Admitting: Physician Assistant

## 2021-12-28 ENCOUNTER — Ambulatory Visit: Payer: Medicare Other | Admitting: Cardiology

## 2021-12-28 DIAGNOSIS — I714 Abdominal aortic aneurysm, without rupture, unspecified: Secondary | ICD-10-CM

## 2021-12-28 DIAGNOSIS — Z720 Tobacco use: Secondary | ICD-10-CM

## 2021-12-28 DIAGNOSIS — I10 Essential (primary) hypertension: Secondary | ICD-10-CM

## 2021-12-28 DIAGNOSIS — I2510 Atherosclerotic heart disease of native coronary artery without angina pectoris: Secondary | ICD-10-CM

## 2021-12-28 DIAGNOSIS — E782 Mixed hyperlipidemia: Secondary | ICD-10-CM
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
## 2022-01-04 ENCOUNTER — Ambulatory Visit: Payer: Medicare Other | Attending: Cardiology | Admitting: Physician Assistant

## 2022-01-04 ENCOUNTER — Encounter: Payer: Self-pay | Admitting: Physician Assistant

## 2022-01-04 VITALS — BP 100/64 | HR 63 | Ht 66.5 in | Wt 183.0 lb

## 2022-01-04 DIAGNOSIS — I2510 Atherosclerotic heart disease of native coronary artery without angina pectoris: Secondary | ICD-10-CM

## 2022-01-04 DIAGNOSIS — E782 Mixed hyperlipidemia: Secondary | ICD-10-CM

## 2022-01-04 DIAGNOSIS — R072 Precordial pain: Secondary | ICD-10-CM

## 2022-01-04 DIAGNOSIS — I714 Abdominal aortic aneurysm, without rupture, unspecified: Secondary | ICD-10-CM

## 2022-01-04 DIAGNOSIS — I10 Essential (primary) hypertension: Secondary | ICD-10-CM

## 2022-01-04 DIAGNOSIS — Z720 Tobacco use: Secondary | ICD-10-CM | POA: Diagnosis not present

## 2022-01-04 MED ORDER — ISOSORBIDE MONONITRATE ER 30 MG PO TB24: 15.0000 mg | ORAL_TABLET | Freq: Every day | ORAL | 3 refills | Status: DC

## 2022-01-04 NOTE — Patient Instructions (Addendum)
Medication Instructions:  DECREASE Imdur to 15 mg daily  *If you need a refill on your cardiac medications before your next appointment, please call your pharmacy*  Lab Work: NONE ordered at this time of appointment   If you have labs (blood work) drawn today and your tests are completely normal, you will receive your results only by: [place] (if you have MyChart) OR A paper copy in the mail If you have any lab test that is abnormal or we need to change your treatment, we will call you to review the results.  Testing/Procedures: NONE ordered at this time of appointment   Follow-Up: At [facility], you and your health needs are our priority.  As part of our continuing mission to provide you with exceptional heart care, we have created designated Provider Care Teams.  These Care Teams include your primary Cardiologist (physician) and Advanced Practice Providers (APPs -  Physician Assistants and Nurse Practitioners) who all work together to provide you with the care you need, when you need it.  We recommend signing up for the patient portal called "MyChart".  Sign up information is provided on this After Visit Summary.  MyChart is used to connect with patients for Virtual Visits (Telemedicine).  Patients are able to view lab/test results, encounter notes, upcoming appointments, etc.  Non-urgent messages can be sent to your provider as well.   To learn more about what you can do with MyChart, go to [email or web address].    Your next appointment:   4-5 month(s)  The format for your next appointment:   In Person  Provider:   Berniece Salines, DO     Other Instructions Speak with your PCP and/or Neurologist about the rash and weight gain, if secondary to Gabapentin.   Important Information About Sugar

## 2022-02-16 DIAGNOSIS — G4700 Insomnia, unspecified: Secondary | ICD-10-CM | POA: Insufficient documentation

## 2022-02-16 DIAGNOSIS — G8929 Other chronic pain: Secondary | ICD-10-CM

## 2022-02-16 HISTORY — DX: Insomnia, unspecified: G47.00

## 2022-02-16 HISTORY — DX: Other chronic pain: G89.29

## 2022-04-03 DIAGNOSIS — R918 Other nonspecific abnormal finding of lung field: Secondary | ICD-10-CM

## 2022-04-03 HISTORY — DX: Other nonspecific abnormal finding of lung field: R91.8

## 2022-04-24 ENCOUNTER — Ambulatory Visit: Payer: Medicare Other | Attending: Cardiology | Admitting: Cardiology

## 2022-04-24 ENCOUNTER — Encounter: Payer: Self-pay | Admitting: Cardiology

## 2022-04-24 VITALS — BP 106/76 | HR 75 | Ht 66.5 in | Wt 179.0 lb

## 2022-04-24 DIAGNOSIS — E782 Mixed hyperlipidemia: Secondary | ICD-10-CM

## 2022-04-24 DIAGNOSIS — I10 Essential (primary) hypertension: Secondary | ICD-10-CM

## 2022-04-24 DIAGNOSIS — I471 Supraventricular tachycardia, unspecified: Secondary | ICD-10-CM | POA: Diagnosis not present

## 2022-04-24 DIAGNOSIS — Z79899 Other long term (current) drug therapy: Secondary | ICD-10-CM

## 2022-04-24 DIAGNOSIS — I25118 Atherosclerotic heart disease of native coronary artery with other forms of angina pectoris: Secondary | ICD-10-CM

## 2022-04-24 LAB — COMPREHENSIVE METABOLIC PANEL
ALT: 10 IU/L (ref 0–32)
AST: 12 IU/L (ref 0–40)
Albumin/Globulin Ratio: 2.1 (ref 1.2–2.2)
Albumin: 4.4 g/dL (ref 3.9–4.9)
Alkaline Phosphatase: 76 IU/L (ref 44–121)
BUN/Creatinine Ratio: 28 (ref 12–28)
BUN: 35 mg/dL — ABNORMAL HIGH (ref 8–27)
Bilirubin Total: 0.3 mg/dL (ref 0.0–1.2)
CO2: 21 mmol/L (ref 20–29)
Calcium: 9.9 mg/dL (ref 8.7–10.3)
Chloride: 106 mmol/L (ref 96–106)
Creatinine, Ser: 1.23 mg/dL — ABNORMAL HIGH (ref 0.57–1.00)
Globulin, Total: 2.1 g/dL (ref 1.5–4.5)
Glucose: 90 mg/dL (ref 70–99)
Potassium: 4.8 mmol/L (ref 3.5–5.2)
Sodium: 140 mmol/L (ref 134–144)
Total Protein: 6.5 g/dL (ref 6.0–8.5)
eGFR: 48 mL/min/{1.73_m2} — ABNORMAL LOW (ref >59)

## 2022-04-24 LAB — MAGNESIUM: Magnesium: 2.7 mg/dL — ABNORMAL HIGH (ref 1.6–2.3)

## 2022-04-24 LAB — LIPID PANEL
Chol/HDL Ratio: 3.8 ratio (ref 0.0–4.4)
Cholesterol, Total: 144 mg/dL (ref 100–199)
HDL: 38 mg/dL — ABNORMAL LOW (ref >39)
LDL Chol Calc (NIH): 78 mg/dL (ref 0–99)
Triglycerides: 159 mg/dL — ABNORMAL HIGH (ref 0–149)
VLDL Cholesterol Cal: 28 mg/dL (ref 5–40)

## 2022-04-24 NOTE — Progress Notes (Signed)
Cardiology Office Note:    Date:  04/26/2022   ID:  Lindsey, [name] June 21, 1955, MRN [id number]  PCP:  Rich Fuchs, PA  Cardiologist:  Berniece Salines, DO  Electrophysiologist:  None   Referring MD: Rich Fuchs, PA   " I am doing ok"   History of Present Illness:    [name] is a 67 y.o. 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  She is here today for follow-up visit.  When I saw the patient in August 2022 at that time she was experiencing chest discomfort I recommend she undergo a left heart catheterization given her high risk factors for coronary artery disease.  I saw the patient in November 2022 at that time she was not experiencing any symptoms.  She had had intermittent palpitations.  We will continue on the aspirin as well as her atorvastatin.  Blood pressure was within normal.  She was in August 2023 at that time she experiencing some chest discomfort and had been using multiple sublingual nitro patient on Imdur 30 mg daily.  Since her last visit she has been seen as a hospital follow-up where she presented to the ED for concern for stroke.  During that visit her Imdur was cut back to 50 mg daily.  She has not had any hospitalizations since then she has been doing well.  She recently since December 27 tells me she has had multiple deaths in the family.  She is lost 7 people.  This has been disheartening.    Past Medical History:  Diagnosis Date   AAA (abdominal aortic aneurysm) (HCC)    Cognitive change    SVT (supraventricular tachycardia)     Past Surgical History:  Procedure Laterality Date   ABLATION     3times   APPENDECTOMY     LEFT HEART CATH AND CORONARY  ANGIOGRAPHY N/A 11/01/2020   Procedure: LEFT HEART CATH AND CORONARY ANGIOGRAPHY;  Surgeon: Burnell Blanks, MD;  Location: [facility];  Service: Cardiovascular;  Laterality: N/A;   MASTECTOMY PARTIAL / LUMPECTOMY Left    Multiple fx repaired     TOTAL ABDOMINAL HYSTERECTOMY      Current Medications: Current Meds  Medication Sig   acetaminophen (TYLENOL) 500 MG tablet Take 1,000-1,500 mg by mouth at bedtime as needed for moderate pain.   aspirin EC 81 MG tablet Take 81 mg by mouth every other day. Swallow whole.   atorvastatin (LIPITOR) 20 MG tablet Take 20 mg by mouth daily.   calcium carbonate (TUMS - DOSED IN MG ELEMENTAL CALCIUM) 500 MG chewable tablet Chew 1,000 mg by mouth daily as needed for indigestion or heartburn.   gabapentin (NEURONTIN) 300 MG capsule 1 tab by mouth in the evening for 1 week, then 1 tab twice a day for 1 week, then 1 tab three times a day   isosorbide mononitrate (IMDUR) 30 MG 24 hr tablet Take 0.5 tablets (15 mg total) by mouth daily.   Liniments (BLUE-EMU SUPER STRENGTH EX) Apply 1 application topically daily as needed (pain).   Magnesium Gluconate (MAG-G) 500 (27 Mg) MG TABS Take by mouth.   metoprolol tartrate (LOPRESSOR) 25 MG tablet Take 1 tablet by mouth twice daily   nitroGLYCERIN (NITROSTAT) 0.4  MG SL tablet Place 1 tablet (0.4 mg total) under the tongue every 5 (five) minutes as needed for chest pain.   omeprazole (PRILOSEC) 40 MG capsule Take by mouth.   topiramate (TOPAMAX) 100 MG tablet Take 200 mg by mouth 2 (two) times daily.     Allergies:   Latex, Other, Codeine, and Tape   Social History   Socioeconomic History   Marital status: Married    Spouse name: Not on file   Number of children: Not on file   Years of education: Not on file   Highest education level: Not on file  Occupational History   Not on file  Tobacco Use   Smoking status: Every Day    Types: Cigarettes   Smokeless tobacco: Never  Substance and Sexual  Activity   Alcohol use: Not Currently   Drug use: Not Currently   Sexual activity: Not Currently  Other Topics Concern   Not on file  Social History Narrative   Not on file   Social Determinants of Health   Financial Resource Strain: Not on file  Food Insecurity: Not on file  Transportation Needs: Not on file  Physical Activity: Not on file  Stress: Not on file  Social Connections: Not on file     Family History: The patient's family history is not on file.  ROS:   Review of Systems  Constitution: Negative for decreased appetite, fever and weight gain.  HENT: Negative for congestion, ear discharge, hoarse voice and sore throat.   Eyes: Negative for discharge, redness, vision loss in right eye and visual halos.  Cardiovascular: Negative for chest pain, dyspnea on exertion, leg swelling, orthopnea and palpitations.  Respiratory: Negative for cough, hemoptysis, shortness of breath and snoring.   Endocrine: Negative for heat intolerance and polyphagia.  Hematologic/Lymphatic: Negative for bleeding problem. Does not bruise/bleed easily.  Skin: Negative for flushing, nail changes, rash and suspicious lesions.  Musculoskeletal: Negative for arthritis, joint pain, muscle cramps, myalgias, neck pain and stiffness.  Gastrointestinal: Negative for abdominal pain, bowel incontinence, diarrhea and excessive appetite.  Genitourinary: Negative for decreased libido, genital sores and incomplete emptying.  Neurological: Negative for brief paralysis, focal weakness, headaches and loss of balance.  Psychiatric/Behavioral: Negative for altered mental status, depression and suicidal ideas.  Allergic/Immunologic: Negative for HIV exposure and persistent infections.    EKGs/Labs/Other Studies Reviewed:    The following studies were reviewed today:   EKG:  The ekg ordered today demonstrates sinus rhythm, heart rate 60 bpm.  Tte 11/08/2020 IMPRESSIONS   1. Vertical orientation of the LV . Left  ventricular ejection fraction,  by estimation, is 55 to 60%. The left ventricle has normal function. The  left ventricle has no regional wall motion abnormalities. There is mild  concentric left ventricular  hypertrophy. Left ventricular diastolic parameters are consistent with  Grade I diastolic dysfunction (impaired relaxation).   2. Right ventricular systolic function is normal. The right ventricular  size is normal. There is normal pulmonary artery systolic pressure.   3. The mitral valve is degenerative. No evidence of mitral valve  regurgitation. No evidence of mitral stenosis.   4. The aortic valve is normal in structure. Aortic valve regurgitation is  mild. Mild aortic valve sclerosis is present, with no evidence of aortic  valve stenosis.   5. The inferior vena cava is normal in size with greater than 50%  respiratory variability, suggesting right atrial pressure of 3 mmHg.   FINDINGS   Left Ventricle: Vertical orientation  of the LV. Left ventricular ejection  fraction, by estimation, is 55 to 60%. The left ventricle has normal  function. The left ventricle has no regional wall motion abnormalities.  Global longitudinal strain performed  but not reported based on interpreter judgement due to suboptimal  tracking. The left ventricular internal cavity size was normal in size.  There is mild concentric left ventricular hypertrophy. Left ventricular  diastolic parameters are consistent with  Grade I diastolic dysfunction (impaired relaxation). Normal left  ventricular filling pressure.   Right Ventricle: The right ventricular size is normal. No increase in  right ventricular wall thickness. Right ventricular systolic function is  normal. There is normal pulmonary artery systolic pressure. The tricuspid  regurgitant velocity is 2.38 m/s, and   with an assumed right atrial pressure of 3 mmHg, the estimated right  ventricular systolic pressure is [id number] mmHg.   Left [facility]: Left  atrial size was normal in size.   Right [facility]: Right atrial size was normal in size.   Pericardium: There is no evidence of pericardial effusion.   Mitral Valve: The mitral valve is degenerative in appearance. Mild mitral  annular calcification. No evidence of mitral valve regurgitation. No  evidence of mitral valve stenosis.   Tricuspid Valve: The tricuspid valve is normal in structure. Tricuspid  valve regurgitation is trivial. No evidence of tricuspid stenosis.   Aortic Valve: The aortic valve is normal in structure. Aortic valve  regurgitation is mild. Aortic regurgitation PHT measures 533 msec. Mild  aortic valve sclerosis is present, with no evidence of aortic valve  stenosis.   Pulmonic Valve: The pulmonic valve was normal in structure. Pulmonic valve  regurgitation is not visualized. No evidence of pulmonic stenosis.   Aorta: The aortic root and ascending aorta are structurally normal, with  no evidence of dilitation and the aortic arch was not well visualized.   Venous: The pulmonary veins were not well visualized. The inferior vena  cava is normal in size with greater than 50% respiratory variability,  suggesting right atrial pressure of 3 mmHg.   IAS/Shunts: No atrial level shunt detected by color flow Doppler.    LHC 11/01/2020  Prox RCA lesion is 30% stenosed.   Mid RCA lesion is 20% stenosed.   2nd Mrg lesion is 20% stenosed.   Mid LAD lesion is 100% stenosed.   There is mild left ventricular systolic dysfunction.   LV end diastolic pressure is normal.   The left ventricular ejection fraction is 45-50% by visual estimate.   There is no mitral valve regurgitation.   The LAD is a large caliber vessel that courses to the apex. The LAD is occluded beyond the takeoff of the large diagonal branch. The LAD is diffusely diseased beyond the chronic occlusion and fills from left to left collaterals.  Mild plaque in the Circumflex The RCA is a large dominant vessel with  mild aneurysmal dilation of the proximal vessel, mild plaque.  LVEF 45% with mild anterior wall motion abnormality  Recommendations: Medical management of CAD. The LAD is not a favorable target for PCI. The vessel fills from collaterals. Consider anti-anginal therapy. Echo pending  Recent Labs: 11/10/2021: Hemoglobin 15.6; Platelets 168 04/24/2022: ALT 10; BUN 35; Creatinine, Ser 1.23; Magnesium 2.7; Potassium 4.8; Sodium 140  Recent Lipid Panel    Component Value Date/Time   CHOL 144 04/24/2022 1039   TRIG 159 (H) 04/24/2022 1039   HDL 38 (L) 04/24/2022 1039   CHOLHDL 3.8 04/24/2022 1039   CHOLHDL 5.8  CALC 10/21/2007 0849   VLDL 33 10/21/2007 0849   LDLCALC 78 04/24/2022 1039    Physical Exam:    VS:  BP 106/76   Pulse 75   Ht 5' 6.5" (1.689 m)   Wt 81.2 kg   SpO2 94%   BMI 28.46 kg/m     Wt Readings from Last 3 Encounters:  04/24/22 81.2 kg  01/04/22 83 kg  11/10/21 82.1 kg     GEN: Well nourished, well developed in no acute distress HEENT: Normal NECK: No JVD; No carotid bruits LYMPHATICS: No lymphadenopathy CARDIAC: S1S2 noted,RRR, no murmurs, rubs, gallops RESPIRATORY:  Clear to auscultation without rales, wheezing or rhonchi  ABDOMEN: Soft, non-tender, non-distended, +bowel sounds, no guarding. EXTREMITIES: No edema, No cyanosis, no clubbing MUSCULOSKELETAL:  No deformity  SKIN: Warm and dry NEUROLOGIC:  Alert and oriented x 3, non-focal PSYCHIATRIC:  Normal affect, good insight  ASSESSMENT:    1. Medication management   2. Essential (primary) hypertension   3. Coronary artery disease of native artery of native heart with stable angina pectoris ([place])   4. SVT (supraventricular tachycardia)   5. Mixed hyperlipidemia    PLAN:    She appears to be doing well from a cardiovascular standpoint.  No medication changes today.  Will get blood work for [place] as well as lipid profile.   The patient is in agreement with the above plan. The patient left the  office in stable condition.  The patient will follow up in 6 months   Medication Adjustments/Labs and Tests Ordered: Current medicines are reviewed at length with the patient today.  Concerns regarding medicines are outlined above.  Orders Placed This Encounter  Procedures   Comprehensive Metabolic Panel (CMET)   Lipid panel   Magnesium   No orders of the defined types were placed in this encounter.   Patient Instructions  Medication Instructions:  Your physician recommends that you continue on your current medications as directed. Please refer to the Current Medication list given to you today.  *If you need a refill on your cardiac medications before your next appointment, please call your pharmacy*   Lab Work: Your physician recommends that you have labs drawn today: CMET, Mag, Lipids If you have labs (blood work) drawn today and your tests are completely normal, you will receive your results only by: [place] (if you have MyChart) OR A paper copy in the mail If you have any lab test that is abnormal or we need to change your treatment, we will call you to review the results.   Testing/Procedures: None   Follow-Up: At [facility], you and your health needs are our priority.  As part of our continuing mission to provide you with exceptional heart care, we have created designated Provider Care Teams.  These Care Teams include your primary Cardiologist (physician) and Advanced Practice Providers (APPs -  Physician Assistants and Nurse Practitioners) who all work together to provide you with the care you need, when you need it.  We recommend signing up for the patient portal called "MyChart".  Sign up information is provided on this After Visit Summary.  MyChart is used to connect with patients for Virtual Visits (Telemedicine).  Patients are able to view lab/test results, encounter notes, upcoming appointments, etc.  Non-urgent messages can be sent to your provider  as well.   To learn more about what you can do with MyChart, go to [email or web address].    Your next appointment:   1  year(s)  Provider:   Berniece Salines, DO     Adopting a Healthy Lifestyle.  Know what a healthy weight is for you (roughly BMI <25) and aim to maintain this   Aim for 7+ servings of fruits and vegetables daily   65-80+ fluid ounces of water or unsweet tea for healthy kidneys   Limit to max 1 drink of alcohol per day; avoid smoking/tobacco   Limit animal fats in diet for cholesterol and heart health - choose grass fed whenever available   Avoid highly processed foods, and foods high in saturated/trans fats   Aim for low stress - take time to unwind and care for your mental health   Aim for 150 min of moderate intensity exercise weekly for heart health, and weights twice weekly for bone health   Aim for 7-9 hours of sleep daily   When it comes to diets, agreement about the perfect plan isnt easy to find, even among the experts. Experts at the [place] developed an idea known as the Healthy Eating Plate. Just imagine a plate divided into logical, healthy portions.   The emphasis is on diet quality:   Load up on vegetables and fruits - one-half of your plate: Aim for color and variety, and remember that potatoes dont count.   Go for whole grains - one-quarter of your plate: Whole wheat, barley, wheat berries, quinoa, oats, brown rice, and foods made with them. If you want pasta, go with whole wheat pasta.   Protein power - one-quarter of your plate: Fish, chicken, beans, and nuts are all healthy, versatile protein sources. Limit red meat.   The diet, however, does go beyond the plate, offering a few other suggestions.   Use healthy plant oils, such as olive, canola, soy, corn, sunflower and peanut. Check the labels, and avoid partially hydrogenated oil, which have unhealthy trans fats.   If youre thirsty, drink water. Coffee and tea  are good in moderation, but skip sugary drinks and limit milk and dairy products to one or two daily servings.   The type of carbohydrate in the diet is more important than the amount. Some sources of carbohydrates, such as vegetables, fruits, whole grains, and beans-are healthier than others.   Finally, stay active  Signed, Berniece Salines, DO  04/26/2022 8:20 AM    [facility]

## 2022-04-24 NOTE — Patient Instructions (Signed)
Medication Instructions:  Your physician recommends that you continue on your current medications as directed. Please refer to the Current Medication list given to you today.  *If you need a refill on your cardiac medications before your next appointment, please call your pharmacy*   Lab Work: Your physician recommends that you have labs drawn today: CMET, Mag, Lipids If you have labs (blood work) drawn today and your tests are completely normal, you will receive your results only by: [place] (if you have MyChart) OR A paper copy in the mail If you have any lab test that is abnormal or we need to change your treatment, we will call you to review the results.   Testing/Procedures: None   Follow-Up: At [facility], you and your health needs are our priority.  As part of our continuing mission to provide you with exceptional heart care, we have created designated Provider Care Teams.  These Care Teams include your primary Cardiologist (physician) and Advanced Practice Providers (APPs -  Physician Assistants and Nurse Practitioners) who all work together to provide you with the care you need, when you need it.  We recommend signing up for the patient portal called "MyChart".  Sign up information is provided on this After Visit Summary.  MyChart is used to connect with patients for Virtual Visits (Telemedicine).  Patients are able to view lab/test results, encounter notes, upcoming appointments, etc.  Non-urgent messages can be sent to your provider as well.   To learn more about what you can do with MyChart, go to [email or web address].    Your next appointment:   1 year(s)  Provider:   Berniece Salines, DO

## 2022-06-30 ENCOUNTER — Telehealth: Payer: Self-pay | Admitting: Cardiology

## 2022-06-30 NOTE — Telephone Encounter (Signed)
Will route to Dr.Tobb.   Last AAA completed on 10/2021. Do you recommend to repeat it this year?  Thanks!

## 2022-06-30 NOTE — Telephone Encounter (Signed)
Pt received a notice from [facility] regarding her being overdue for a AAA [place]. She states that her last one with [facility] was 2-3 years ago. She had an US done 11/09/21. She is wondering if Dr. Servando Salina thinks she is due for another one this year. She expresses that she does not want to go back to [place], she wants to stay within [facility]. Please advise.

## 2022-07-03 ENCOUNTER — Other Ambulatory Visit: Payer: Self-pay

## 2022-07-03 DIAGNOSIS — I714 Abdominal aortic aneurysm, without rupture, unspecified: Secondary | ICD-10-CM

## 2022-07-03 NOTE — Telephone Encounter (Signed)
AAA duplex ordered.   Scheduling team: please reach out and get scheduled.   Thank you!

## 2022-07-04 DIAGNOSIS — R12 Heartburn: Secondary | ICD-10-CM | POA: Insufficient documentation

## 2022-07-04 DIAGNOSIS — D229 Melanocytic nevi, unspecified: Secondary | ICD-10-CM | POA: Insufficient documentation

## 2022-10-31 ENCOUNTER — Telehealth: Payer: Self-pay | Admitting: Cardiology

## 2022-10-31 NOTE — Telephone Encounter (Signed)
Patient stats not currently having chest pain.  States 11 pm had chest pain lasting 35-40 minutes. She too nitroglycerin times 1 and baby aspirin.  She states she fell asleep and woke up 20 minutes later and only felt discomfort and it was under Right breast at that time.  The episode started with nausea.  She thought maybe it was food.  Took 2 tums and went to bed.  Woke up later and it was nausea with tightness in chest from center to right breast. She states it was going up her back to her jaw.  Did not call EMS. She states having episode 3 weeks ago with same symptoms. Did not go to ED at that time either.  She states rested on side of road for a while then they continued their trip.   She states no pain or tightness since that time last night,  She states she had this a year ago and 3 weeks ago.  She is scheduled for APP for Friday but advised if it occurs again then to call 911 and seek medical attention immediatly

## 2022-10-31 NOTE — Telephone Encounter (Signed)
   Pt c/o of Chest Pain: STAT if active CP, including tightness, pressure, jaw pain, radiating pain to shoulder/upper arm/back, CP unrelieved by Nitro. Symptoms reported of SOB, nausea, vomiting, sweating.  1. Are you having CP right now? Yeah     2. Are you experiencing any other symptoms (ex. SOB, nausea, vomiting, sweating)? "Not right now but had all last night"    3. Is your CP continuous or coming and going? Coming and going    4. Have you taken Nitroglycerin? Yes    5. How long have you been experiencing CP? 2nd time in 3 weeks     6. If NO CP at time of call then end call with telling Pt to call back or call 911 if Chest pain returns prior to return call from triage team.

## 2022-11-01 NOTE — Progress Notes (Signed)
Cardiology Clinic Note   Date: 11/03/2022 ID: Evamae, Pluchino 06-09-1955, MRN [id number]  Primary Cardiologist:  Thomasene Ripple, DO  Patient Profile    [name] is a 67 y.o. female who presents to the clinic today for evaluation of chest pain.     Past medical history significant for: CAD. LHC 11/01/2020 (angina): Proximal RCA 30%.  Mid RCA 20%.  OM2 20%.  Mid LAD 100% with left to left collaterals.  The LAD is not favorable target for PCI.  Vessel fills from collaterals.  Consider antianginal therapy. Echo 11/08/2020: EF 55 to 60%.  Mild concentric LVH.  Grade I DD.  Normal RV function.  Normal PA pressure.  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  Mild aortic valve sclerosis without stenosis. AAA. AAA ultrasound 11/09/2021: There is evidence of abnormal dilatation of the distal abdominal aorta, abnormal dilatation of the right common iliac artery and left common iliac artery.  The largest aortic diameter is 3.0 and remains unchanged from prior exam June 2022.  No evidence of thrombus in IVC. SVT. S/p ablation. Hypertension. Hyperlipidemia. Lipid panel 04/24/2022: LDL 78, HDL 38, TG 159, total 144. OSA. GERD. Migraines. Tobacco abuse. Bilateral breast cancer. S/p radiation and lumpectomy.     History of Present Illness    [name] was first evaluated by Dr. Servando Salina on 10/27/2020 for chest pain at the request of Tobie Lords, PA-C.  Patient's medical history significant for SVT s/p ablation and followed by Dr. Graciela Husbands for many years with last visit in 2008.  She has a family history of premature CAD.  She had recently been diagnosed with AAA and bilateral carotid atherosclerosis.  Symptoms concerning for angina she was scheduled for LHC which showed mid LAD 100% with left to left collaterals not favorable for PCI.  She continues to be followed by Dr. Servando Salina for the above outlined history.  She was last seen in the office by Dr. Servando Salina on 04/24/2022 for routine follow-up.  She  was doing well at that time and no medication changes were made.  Patient contacted the office on 10/31/2022 with complaints of 2 episodes of chest pain chest pain in 3 weeks.  Per triage LPN "Patient stats not currently having chest pain.  States 11 pm had chest pain lasting 35-40 minutes. She too nitroglycerin times 1 and baby aspirin.  She states she fell asleep and woke up 20 minutes later and only felt discomfort and it was under Right breast at that time. The episode started with nausea.  She thought maybe it was food.  Took 2 tums and went to bed.  Woke up later and it was nausea with tightness in chest from center to right breast. She states it was going up her back to her jaw.  Did not call EMS. She states having episode 3 weeks ago with same symptoms. Did not go to ED at that time either.  She states rested on side of road for a while then they continued their trip. She states no pain or tightness since that time last night,  She states she had this a year ago and 3 weeks ago.  She is scheduled for APP for Friday but advised if it occurs again then to call 911 and seek medical attention immediatly."  Today, patient is here alone. She reports no further episodes of chest pain. She states episode began as nausea at around 2 am. She got out of bed and sat in her living  room. She then developed right sided chest pain under her breast. She took an 81 mg aspirin. Pain then began radiating to right neck and into jaw. She took another aspirin and then NTG x 1. She was then able to go back to sleep. The total episode lasted about 45 minutes. She states "I was not too worried about it and I did not feel I needed to go to the hospital or get my neighbor who is a Charity fundraiser." She did not have associated shortness of breath. She does have baseline shortness of breath. She has also noticed more dyspnea with exertion than normal. She continues to have infrequent palpitations. She relates most of her symptoms to stress as  her daughter had a double mastectomy 3 months ago and she has a lot going on with at home. She does not smoke regularly but will have an occasional cigarette related to stress.     ROS: All other systems reviewed and are otherwise negative except as noted in History of Present Illness.  Studies Reviewed    EKG Interpretation Date/Time:  Friday November 03 2022 08:54:38 EDT Ventricular Rate:  69 PR Interval:  148 QRS Duration:  114 QT Interval:  410 QTC Calculation: 439 R Axis:   -31  Text Interpretation: Normal sinus rhythm Left axis deviation Right bundle branch block When compared with ECG of 10-Nov-2021 15:23, Fusion complexes are no longer Present Left anterior fascicular block is no longer Present Confirmed by Carlos Levering [phone]) on 11/03/2022 9:01:00 AM           Physical Exam    VS:  BP 114/74   Pulse 69   Ht 5\' 6"  (1.676 m)   Wt 174 lb (78.9 kg)   SpO2 93%   BMI 28.08 kg/m  , BMI Body mass index is 28.08 kg/m.  GEN: Well nourished, well developed, in no acute distress. Neck: No JVD or carotid bruits. Cardiac:  RRR. No murmurs. No rubs or gallops.   Respiratory:  Respirations regular and unlabored. Clear to auscultation without rales, wheezing or rhonchi. GI: Soft, nontender, nondistended. Extremities: Radials/DP/PT 2+ and equal bilaterally. No clubbing or cyanosis. No edema.  Skin: Warm and dry, no rash. Neuro: Strength intact.  Assessment & Plan    CAD/chest pain.  LHC August 2022 showed mid LAD 100% with left to left collaterals not favorable for PCI.  Patient reports a 45 minute episode of right sided chest pain that radiated up neck to jaw with associated nausea for which she took 2 81 mg aspirin and 1 NTG x 1. She has had no further episodes. EKG today shows NSR with RBBB. Discussed nuclear stress test versus PET CT. Patient is in agreement with ordering PET CT. If she has another episode of pain we can change that to a nuclear stress test to get her in  sooner. Provided ED precautions. Continue aspirin, atorvastatin, isosorbide, metoprolol, as needed SL NTG. DOE. Patient reports DOE is somewhat worse than usual. She is unsure if this is related to stress as she is stressed about her daughter who recently had a double mastectomy. She does have some baseline shortness of breath. Will get an echo for further evaluation.  AAA.  Ultrasound August 2023 showed stable dilatation of abdominal aorta and right and left common iliac arteries unchanged from June 2022.  It appears repeat AAA ultrasound was ordered but has not yet been scheduled. She will schedule that today.  Hypertension: BP today 114/74.  Patient denies headaches, dizziness  or vision changes. Continue metoprolol, isosorbide. Hyperlipidemia.  LDL February 2024 78, not at goal.  Patient was instructed to work on diet.  Continue atorvastatin.  Tobacco abuse. She does not smoke regularly but will have an occasional cigarette when she is stressed. Encouraged complete cessation.  Migraines. She would like to be referred to [facility] Neurology, as she does not want to continue with [facility] neurology.   Disposition: CBC, BMP, Mg. Echo and PET CT. Refer to neurology. Return in 3 months or sooner as needed.      Informed Consent   Shared Decision Making/Informed Consent The risks [chest pain, shortness of breath, cardiac arrhythmias, dizziness, blood pressure fluctuations, myocardial infarction, stroke/transient ischemic attack, nausea, vomiting, allergic reaction, radiation exposure, metallic taste sensation and life-threatening complications (estimated to be 1 in 10,000)], benefits (risk stratification, diagnosing coronary artery disease, treatment guidance) and alternatives of a cardiac PET stress test were discussed in detail with Ms. Ewy and she agrees to proceed.      Signed, Etta Grandchild. Tiombe Tomeo, DNP, NP-C

## 2022-11-03 ENCOUNTER — Ambulatory Visit: Payer: Medicare Other | Admitting: Student

## 2022-11-03 ENCOUNTER — Encounter: Payer: Self-pay | Admitting: Student

## 2022-11-03 VITALS — BP 114/74 | HR 69 | Ht 66.0 in | Wt 174.0 lb

## 2022-11-03 DIAGNOSIS — I25118 Atherosclerotic heart disease of native coronary artery with other forms of angina pectoris: Secondary | ICD-10-CM

## 2022-11-03 DIAGNOSIS — R079 Chest pain, unspecified: Secondary | ICD-10-CM | POA: Diagnosis not present

## 2022-11-03 DIAGNOSIS — Z720 Tobacco use: Secondary | ICD-10-CM

## 2022-11-03 DIAGNOSIS — R0609 Other forms of dyspnea: Secondary | ICD-10-CM | POA: Diagnosis not present

## 2022-11-03 DIAGNOSIS — E785 Hyperlipidemia, unspecified: Secondary | ICD-10-CM

## 2022-11-03 DIAGNOSIS — Z8669 Personal history of other diseases of the nervous system and sense organs: Secondary | ICD-10-CM | POA: Diagnosis not present

## 2022-11-03 DIAGNOSIS — I714 Abdominal aortic aneurysm, without rupture, unspecified: Secondary | ICD-10-CM

## 2022-11-03 DIAGNOSIS — I10 Essential (primary) hypertension: Secondary | ICD-10-CM

## 2022-11-03 NOTE — Patient Instructions (Addendum)
Medication Instructions:  Your physician recommends that you continue on your current medications as directed. Please refer to the Current Medication list given to you today.  *If you need a refill on your cardiac medications before your next appointment, please call your pharmacy*   Lab Work: CBC, BMET, Magnesium today.  Testing/Procedures: Your physician has requested that you have an echocardiogram. Echocardiography is a painless test that uses sound waves to create images of your heart. It provides your doctor with information about the size and shape of your heart and how well your heart's chambers and valves are working. This procedure takes approximately one hour. There are no restrictions for this procedure. Please do NOT wear cologne, perfume, aftershave, or lotions (deodorant is allowed). Please arrive 15 minutes prior to your appointment time.  CARDIAC PET- Your physician has requested that you have a Cardiac Pet Stress Test. This testing is completed at [facility] ([address] [place], [place] [address]). The schedulers will call you to get this scheduled. Please follow instructions below and call the office with any questions/concerns [phone]).   Schedule VAS [place] AAA Duplex (order in EPIC by Dr. Servando Salina)      Follow-Up: At [facility], you and your health needs are our priority.  As part of our continuing mission to provide you with exceptional heart care, we have created designated Provider Care Teams.  These Care Teams include your primary Cardiologist (physician) and Advanced Practice Providers (APPs -  Physician Assistants and Nurse Practitioners) who all work together to provide you with the care you need, when you need it.  We recommend signing up for the patient portal called "MyChart".  Sign up information is provided on this After Visit Summary.  MyChart is used to connect with patients for Virtual Visits (Telemedicine).  Patients  are able to view lab/test results, encounter notes, upcoming appointments, etc.  Non-urgent messages can be sent to your provider as well.   To learn more about what you can do with MyChart, go to [email or web address].    Your next appointment:   3 month(s)  Provider:   Thomasene Ripple, DO     Other Instructions Referral placed. PCP list given to pt  How to Prepare for Your Cardiac PET/CT Stress Test:  1. Please do not take these medications before your test:   Medications that may interfere with the cardiac pharmacological stress agent (ex. nitrates - including erectile dysfunction medications, isosorbide mononitrate, tamulosin or beta-blockers) the day of the exam. (Erectile dysfunction medication should be held for at least 72 hrs prior to test) Theophylline containing medications for 12 hours. Dipyridamole 48 hours prior to the test. Your remaining medications may be taken with water.  2. Nothing to eat or drink, except water, 3 hours prior to arrival time.   NO caffeine/decaffeinated products, or chocolate 12 hours prior to arrival.  3. NO perfume, cologne or lotion  4. Total time is 1 to 2 hours; you may want to bring reading material for the waiting time.  5. Please report to Radiology at the [facility] Main Entrance 30 minutes early for your test.  [address] [place], [place] [address]  6. Please report to Radiology at [facility] Main Entrance, medical mall, 30 mins prior to your test.  [address]  [place], [place]  [phone]  Diabetic Preparation:  Hold oral medications. You may take NPH and Lantus insulin. Do not take Humalog or Humulin R (  Regular Insulin) the day of your test. Check blood sugars prior to leaving the house. If able to eat breakfast prior to 3 hour fasting, you may take all medications, including your insulin, Do not worry if you miss your breakfast dose of insulin - start at your next meal.  IF  YOU THINK YOU MAY BE PREGNANT, OR ARE NURSING PLEASE INFORM THE TECHNOLOGIST.  In preparation for your appointment, medication and supplies will be purchased.  Appointment availability is limited, so if you need to cancel or reschedule, please call the Radiology Department at [phone] [facility]) OR [phone] [facility])  24 hours in advance to avoid a cancellation fee of $100.00  What to Expect After you Arrive:  Once you arrive and check in for your appointment, you will be taken to a preparation room within the Radiology Department.  A technologist or Nurse will obtain your medical history, verify that you are correctly prepped for the exam, and explain the procedure.  Afterwards,  an IV will be started in your arm and electrodes will be placed on your skin for EKG monitoring during the stress portion of the exam. Then you will be escorted to the PET/CT scanner.  There, staff will get you positioned on the scanner and obtain a blood pressure and EKG.  During the exam, you will continue to be connected to the EKG and blood pressure machines.  A small, safe amount of a radioactive tracer will be injected in your IV to obtain a series of pictures of your heart along with an injection of a stress agent.    After your Exam:  It is recommended that you eat a meal and drink a caffeinated beverage to counter act any effects of the stress agent.  Drink plenty of fluids for the remainder of the day and urinate frequently for the first couple of hours after the exam.  Your doctor will inform you of your test results within 7-10 business days.  For more information and frequently asked questions, please visit our website : [email or web address]  For questions about your test or how to prepare for your test, please call: Cardiac Imaging Nurse Navigators Office: [phone]

## 2022-11-04 LAB — CBC
Hematocrit: 48.9 % — ABNORMAL HIGH (ref 34.0–46.6)
Hemoglobin: 16.2 g/dL — ABNORMAL HIGH (ref 11.1–15.9)
MCH: 31.2 pg (ref 26.6–33.0)
MCHC: 33.1 g/dL (ref 31.5–35.7)
MCV: 94 fL (ref 79–97)
Platelets: 241 10*3/uL (ref 150–450)
RBC: 5.2 x10E6/uL (ref 3.77–5.28)
RDW: 12.7 % (ref 11.7–15.4)
WBC: 12.3 10*3/uL — ABNORMAL HIGH (ref 3.4–10.8)

## 2022-11-04 LAB — BASIC METABOLIC PANEL WITH GFR
BUN/Creatinine Ratio: 20 (ref 12–28)
BUN: 20 mg/dL (ref 8–27)
CO2: 23 mmol/L (ref 20–29)
Calcium: 10.9 mg/dL — ABNORMAL HIGH (ref 8.7–10.3)
Chloride: 107 mmol/L — ABNORMAL HIGH (ref 96–106)
Creatinine, Ser: 1.01 mg/dL — ABNORMAL HIGH (ref 0.57–1.00)
Glucose: 88 mg/dL (ref 70–99)
Potassium: 5 mmol/L (ref 3.5–5.2)
Sodium: 143 mmol/L (ref 134–144)
eGFR: 61 mL/min/1.73

## 2022-11-04 LAB — MAGNESIUM: Magnesium: 2.5 mg/dL — ABNORMAL HIGH (ref 1.6–2.3)

## 2022-11-09 DIAGNOSIS — N1831 Chronic kidney disease, stage 3a: Secondary | ICD-10-CM | POA: Insufficient documentation

## 2022-11-09 HISTORY — DX: Chronic kidney disease, stage 3a: N18.31

## 2022-11-10 ENCOUNTER — Ambulatory Visit (HOSPITAL_COMMUNITY): Admission: RE | Admit: 2022-11-10 | Payer: Medicare Other | Source: Ambulatory Visit

## 2022-11-10 DIAGNOSIS — I714 Abdominal aortic aneurysm, without rupture, unspecified: Secondary | ICD-10-CM | POA: Diagnosis present

## 2022-11-10 DIAGNOSIS — I7143 Infrarenal abdominal aortic aneurysm, without rupture: Secondary | ICD-10-CM | POA: Diagnosis present

## 2022-11-16 ENCOUNTER — Telehealth: Payer: Self-pay | Admitting: Physician Assistant

## 2022-11-16 NOTE — Telephone Encounter (Signed)
That would be fine with me.   CM

## 2022-11-16 NOTE — Telephone Encounter (Signed)
Received incoming voicemail from patient requesting to schedule New Patient visit with Dr. Ashley Royalty. Patient states she was a previous patient of Dr. Ashley Royalty when he was at [facility] office. Please advise. Okay to schedule?

## 2022-11-17 ENCOUNTER — Encounter: Payer: Self-pay | Admitting: Neurology

## 2022-11-20 NOTE — Telephone Encounter (Signed)
Contacted patient, scheduled an appointment for 12/20/2022 at 10:50am with Dr. Ashley Royalty. Pt aware of appointment.

## 2022-11-23 ENCOUNTER — Ambulatory Visit (HOSPITAL_COMMUNITY): Payer: Medicare Other | Attending: Student

## 2022-11-23 DIAGNOSIS — R0609 Other forms of dyspnea: Secondary | ICD-10-CM | POA: Insufficient documentation

## 2022-11-25 LAB — ECHOCARDIOGRAM COMPLETE
AR max vel: 3.57 cm2
AV Area VTI: 3.45 cm2
AV Area mean vel: 3.37 cm2
AV Mean grad: 7.7 mmHg
AV Peak grad: 14.3 mmHg
Ao pk vel: 1.89 m/s
Area-P 1/2: 3.62 cm2
P 1/2 time: 742 ms
S' Lateral: 2.4 cm

## 2022-11-29 ENCOUNTER — Telehealth (HOSPITAL_COMMUNITY): Payer: Self-pay | Admitting: *Deleted

## 2022-11-29 NOTE — Telephone Encounter (Signed)
Reaching out to patient to offer assistance regarding upcoming cardiac imaging study; pt verbalizes understanding of appt date/time, parking situation and where to check in, pre-test NPO status, and verified current allergies; name and call back number provided for further questions should they arise  Aailyah Dunbar RN Navigator Cardiac Imaging [facility] [phone] office [phone] cell  Patient aware to avoid caffeine 12 hours prior to her cardiac PET scan.  

## 2022-11-30 ENCOUNTER — Other Ambulatory Visit: Payer: Medicare Other

## 2022-11-30 ENCOUNTER — Encounter
Admission: RE | Admit: 2022-11-30 | Discharge: 2022-11-30 | Disposition: A | Discharge status: Home / Self Care | Payer: Medicare Other | Source: Ambulatory Visit | Attending: Student | Admitting: Student

## 2022-11-30 DIAGNOSIS — R079 Chest pain, unspecified: Secondary | ICD-10-CM | POA: Diagnosis present

## 2022-11-30 MED ORDER — REGADENOSON 0.4 MG/5ML IV SOLN
INTRAVENOUS | Status: AC
  Filled 2022-11-30: qty 5

## 2022-11-30 MED ORDER — REGADENOSON 0.4 MG/5ML IV SOLN
0.4000 mg | Freq: Once | INTRAVENOUS | Status: AC
  Administered 2022-11-30: 0.4 mg via INTRAVENOUS
  Filled 2022-11-30: qty 5

## 2022-11-30 MED ORDER — RUBIDIUM RB82 GENERATOR (RUBYFILL)
25.0000 | PACK | Freq: Once | INTRAVENOUS | Status: AC
  Administered 2022-11-30: 20.83 via INTRAVENOUS

## 2022-11-30 NOTE — Progress Notes (Signed)
Patient presents for a cardiac PET stress test and tolerated procedure without incident. She reported some shortness of breath, nausea, and chest pain during the test that resolved by the conclusion of the test. Patient maintained acceptable vital signs throughout the test and was offered caffeine after test.  Patient ambulated out of department with a steady gait.

## 2022-12-01 LAB — NM PET CT CARDIAC PERFUSION MULTI W/ABSOLUTE BLOODFLOW
LV dias vol: 102 mL (ref 46–106)
LV sys vol: 39 mL
MBFR: 2.1
Nuc Rest EF: 62 %
Nuc Stress EF: 66 %
Peak HR: 84 {beats}/min
Rest HR: 64 {beats}/min
Rest MBF: 1.09 ml/g/min
Rest Nuclear Isotope Dose: 20.8 mCi
Rest perfusion cavity size (mL): 102 mL
ST Depression (mm): 0 mm
Stress MBF: 2.29 ml/g/min
Stress Nuclear Isotope Dose: 20.8 mCi
Stress perfusion cavity size (mL): 102 mL
TID: 0.97

## 2022-12-20 ENCOUNTER — Ambulatory Visit (INDEPENDENT_AMBULATORY_CARE_PROVIDER_SITE_OTHER): Payer: Medicare Other | Admitting: Family Medicine

## 2022-12-20 ENCOUNTER — Other Ambulatory Visit: Payer: Self-pay | Admitting: Cardiology

## 2022-12-20 ENCOUNTER — Encounter: Payer: Self-pay | Admitting: Family Medicine

## 2022-12-20 VITALS — BP 106/70 | HR 62 | Ht 66.0 in | Wt 176.0 lb

## 2022-12-20 DIAGNOSIS — G43009 Migraine without aura, not intractable, without status migrainosus: Secondary | ICD-10-CM

## 2022-12-20 DIAGNOSIS — F17200 Nicotine dependence, unspecified, uncomplicated: Secondary | ICD-10-CM

## 2022-12-20 DIAGNOSIS — I10 Essential (primary) hypertension: Secondary | ICD-10-CM

## 2022-12-20 DIAGNOSIS — I2583 Coronary atherosclerosis due to lipid rich plaque: Secondary | ICD-10-CM

## 2022-12-20 DIAGNOSIS — Z1231 Encounter for screening mammogram for malignant neoplasm of breast: Secondary | ICD-10-CM

## 2022-12-20 DIAGNOSIS — D242 Benign neoplasm of left breast: Secondary | ICD-10-CM

## 2022-12-20 DIAGNOSIS — Z853 Personal history of malignant neoplasm of breast: Secondary | ICD-10-CM
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
## 2022-12-24 ENCOUNTER — Encounter: Payer: Self-pay | Admitting: Family Medicine

## 2022-12-24 NOTE — Assessment & Plan Note (Signed)
Status postlumpectomy of the left breast.  Updated mammogram ordered.

## 2022-12-24 NOTE — Assessment & Plan Note (Signed)
Followed by cardiology.  Medical management at this time.  Denies new or worsening anginal symptoms.

## 2022-12-24 NOTE — Assessment & Plan Note (Signed)
Blood pressure is well-controlled at this time.  She will continue current medications.

## 2022-12-24 NOTE — Assessment & Plan Note (Signed)
Counseled on smoking cessation

## 2022-12-24 NOTE — Assessment & Plan Note (Signed)
Extremities no combination of topiramate and Aimovig.  No side effects at this time, will continue.

## 2023-01-03 ENCOUNTER — Ambulatory Visit: Payer: Medicare Other

## 2023-01-03 DIAGNOSIS — Z853 Personal history of malignant neoplasm of breast: Secondary | ICD-10-CM

## 2023-01-03 DIAGNOSIS — Z1231 Encounter for screening mammogram for malignant neoplasm of breast: Secondary | ICD-10-CM
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
## 2023-01-10 ENCOUNTER — Encounter: Payer: Self-pay | Admitting: Neurology

## 2023-01-10 ENCOUNTER — Ambulatory Visit: Payer: Medicare Other | Admitting: Neurology

## 2023-01-10 VITALS — BP 129/82 | HR 82 | Ht 65.0 in | Wt 176.6 lb

## 2023-01-10 DIAGNOSIS — G43009 Migraine without aura, not intractable, without status migrainosus: Secondary | ICD-10-CM

## 2023-01-10 NOTE — Patient Instructions (Signed)
Continue Aimovig 140mg  every 4 weeks and topiramate 200mg  twice daily Excedrin Tension earliest onset of migraine Limit use of pain relievers to no more than 2 days out of week to prevent risk of rebound or medication-overuse headache. Keep headache diary Follow up 6 months.

## 2023-01-16 ENCOUNTER — Other Ambulatory Visit: Payer: Self-pay | Admitting: Physician Assistant

## 2023-01-31 ENCOUNTER — Ambulatory Visit: Payer: Medicare Other | Attending: Cardiology | Admitting: Cardiology

## 2023-01-31 ENCOUNTER — Encounter: Payer: Self-pay | Admitting: Cardiology

## 2023-01-31 ENCOUNTER — Telehealth: Payer: Self-pay | Admitting: Cardiology

## 2023-01-31 VITALS — BP 120/72 | HR 65 | Ht 66.0 in | Wt 178.2 lb

## 2023-01-31 DIAGNOSIS — I2510 Atherosclerotic heart disease of native coronary artery without angina pectoris: Secondary | ICD-10-CM

## 2023-01-31 DIAGNOSIS — Z131 Encounter for screening for diabetes mellitus: Secondary | ICD-10-CM

## 2023-01-31 DIAGNOSIS — I10 Essential (primary) hypertension: Secondary | ICD-10-CM | POA: Diagnosis not present

## 2023-01-31 DIAGNOSIS — E785 Hyperlipidemia, unspecified: Secondary | ICD-10-CM

## 2023-01-31 DIAGNOSIS — Z720 Tobacco use: Secondary | ICD-10-CM

## 2023-01-31 MED ORDER — ISOSORBIDE MONONITRATE ER 30 MG PO TB24: 15.0000 mg | ORAL_TABLET | Freq: Every day | ORAL | 3 refills | Status: DC

## 2023-01-31 MED ORDER — NITROGLYCERIN 0.4 MG SL SUBL: 0.4000 mg | SUBLINGUAL_TABLET | SUBLINGUAL | 3 refills | Status: AC | PRN

## 2023-01-31 MED ORDER — ATORVASTATIN CALCIUM 40 MG PO TABS: 40.0000 mg | ORAL_TABLET | Freq: Every day | ORAL | 3 refills | Status: DC

## 2023-01-31 MED ORDER — RANOLAZINE ER 500 MG PO TB12: 500.0000 mg | ORAL_TABLET | Freq: Two times a day (BID) | ORAL | 3 refills | Status: DC

## 2023-01-31 NOTE — Telephone Encounter (Signed)
Pt c/o medication issue:  1. Name of Medication: Ranexa 500 mg twice daily   2. How are you currently taking this medication (dosage and times per day)? New Script  3. Are you having a reaction (difficulty breathing--STAT)? no  4. What is your medication issue? Pt cannot afford medication. Is there something else she can take?

## 2023-01-31 NOTE — Progress Notes (Signed)
Cardiology Office Note:    Date:  01/31/2023   ID:  Wilmer, Flannigan 1955-05-06, MRN [id number]  PCP:  Everrett Coombe, DO  Cardiologist:  Thomasene Ripple, DO  Electrophysiologist:  None   Referring MD: Heron Nay, PA   " I am doing ok"   History of Present Illness:    [name] is a 67 y.o. 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  She is here today for follow-up visit.  When I saw the patient in August 2022 at that time she was experiencing chest discomfort I recommend she undergo a left heart catheterization given her high risk factors for coronary artery disease.  Since her visit with me she followed with Emmaline Kluver at that time she was she was experiencing chest discomfort a cardiac PET was done she is here today to discuss the result.  The cardiac PET result indicating a possible small area of ischemia. The patient reports occasional chest pain, which she attributes to stress from various family issues, including her daughter's recent heart attack and cancer diagnosis. The patient also expresses concern about her weight, noting that she has gained some weight recently. She has been trying to quit smoking and has reduced her consumption to one carton over three weeks.   Past Medical History:  Diagnosis Date   AAA (abdominal aortic aneurysm) (HCC)    Closed displaced fracture of base of fifth metacarpal bone 09/21/2017   Closed fracture of right patella 08/28/2017   Closed T1 fracture (HCC) 08/28/2017   Cognitive change    Fracture of multiple ribs of right side 08/28/2017   Open right ankle fracture 08/28/2017   SAH (subarachnoid hemorrhage) (HCC) 08/28/2017    SDH (subdural hematoma) (HCC) 08/28/2017   SVT (supraventricular tachycardia) (HCC)     Past Surgical History:  Procedure Laterality Date   ABLATION     3times   APPENDECTOMY     LEFT HEART CATH AND CORONARY ANGIOGRAPHY N/A 11/01/2020   Procedure: LEFT HEART CATH AND CORONARY ANGIOGRAPHY;  Surgeon: Kathleene Hazel, MD;  Location: [facility];  Service: Cardiovascular;  Laterality: N/A;   MASTECTOMY PARTIAL / LUMPECTOMY Left    Multiple fx repaired     TOTAL ABDOMINAL HYSTERECTOMY      Current Medications: Current Meds  Medication Sig   acetaminophen (TYLENOL) 500 MG tablet Take 1,000-1,500 mg by mouth at bedtime as needed for moderate pain.   aspirin EC 81 MG tablet Take 81 mg by mouth every other day. Swallow whole.   atorvastatin (LIPITOR) 40 MG tablet Take 1 tablet (40 mg total) by mouth daily.   calcium carbonate (TUMS - DOSED IN MG ELEMENTAL CALCIUM) 500 MG chewable tablet Chew 1,000 mg by mouth daily as needed for indigestion or heartburn.   Erenumab-aooe (AIMOVIG) 140 MG/ML SOAJ Inject into the skin.   Liniments (BLUE-EMU SUPER STRENGTH EX) Apply 1 application topically daily as needed (pain).   metoprolol tartrate (LOPRESSOR) 25 MG tablet Take 1 tablet by mouth twice daily   ranolazine (RANEXA) 500 MG 12 hr tablet Take 1 tablet (500 mg total) by mouth 2 (two) times daily.   topiramate (TOPAMAX) 100 MG tablet Take 200 mg by mouth 2 (two) times daily.   [DISCONTINUED]  atorvastatin (LIPITOR) 20 MG tablet Take 20 mg by mouth daily.   [DISCONTINUED] isosorbide mononitrate (IMDUR) 30 MG 24 hr tablet Take 0.5 tablets (15 mg total) by mouth daily.     Allergies:   Latex, Other, Codeine, and Tape   Social History   Socioeconomic History   Marital status: Married    Spouse name: Not on file   Number of children: Not on file   Years of education: Not on file   Highest education level: Not on file  Occupational History   Not on file  Tobacco Use   Smoking  status: Some Days    Current packs/day: 0.50    Average packs/day: 0.5 packs/day for 0.9 years (0.4 ttl pk-yrs)    Types: Cigarettes    Start date: 2024   Smokeless tobacco: Never  Vaping Use   Vaping status: Never Used  Substance and Sexual Activity   Alcohol use: Not Currently   Drug use: Not Currently   Sexual activity: Yes    Partners: Male  Other Topics Concern   Not on file  Social History Narrative   Not on file   Social Determinants of Health   Financial Resource Strain: High Risk (11/09/2022)   Received from [facility]   Overall Financial Resource Strain (CARDIA)    Difficulty of Paying Living Expenses: Very hard  Food Insecurity: No Food Insecurity (11/09/2022)   Received from [facility]   Hunger Vital Sign    Worried About Running Out of Food in the Last Year: Never true    Ran Out of Food in the Last Year: Never true  Transportation Needs: No Transportation Needs (11/09/2022)   Received from [facility] - Transportation    Lack of Transportation (Medical): No    Lack of Transportation (Non-Medical): No  Physical Activity: Sufficiently Active (11/09/2022)   Received from [facility]   Exercise Vital Sign    Days of Exercise per Week: 7 days    Minutes of Exercise per Session: 30 min  Stress: Stress Concern Present (11/09/2022)   Received from [facility] - Occupational Stress Questionnaire    Feeling of Stress : To some extent  Social Connections: Socially Integrated (11/09/2022)   Received from [facility]   Social Network    How would you rate your social network (family, work, friends)?: Good participation with social networks     Family History: The patient's family history includes Dementia in her mother; Stroke in her mother.  ROS:   Review of Systems  Constitution: Negative for decreased appetite, fever and weight gain.  HENT: Negative for congestion, ear discharge, hoarse voice and  sore throat.   Eyes: Negative for discharge, redness, vision loss in right eye and visual halos.  Cardiovascular: Negative for chest pain, dyspnea on exertion, leg swelling, orthopnea and palpitations.  Respiratory: Negative for cough, hemoptysis, shortness of breath and snoring.   Endocrine: Negative for heat intolerance and polyphagia.  Hematologic/Lymphatic: Negative for bleeding problem. Does not bruise/bleed easily.  Skin: Negative for flushing, nail changes, rash and suspicious lesions.  Musculoskeletal: Negative for arthritis, joint pain, muscle cramps, myalgias, neck pain and stiffness.  Gastrointestinal: Negative for abdominal pain, bowel incontinence, diarrhea and excessive appetite.  Genitourinary: Negative for decreased libido, genital sores and incomplete emptying.  Neurological: Negative for brief paralysis, focal weakness, headaches and loss of balance.  Psychiatric/Behavioral: Negative for altered mental status, depression and suicidal ideas.  Allergic/Immunologic: Negative for  HIV exposure and persistent infections.    EKGs/Labs/Other Studies Reviewed:    The following studies were reviewed today:   EKG:  None today   Tte 11/08/2020 IMPRESSIONS   1. Vertical orientation of the LV . Left ventricular ejection fraction,  by estimation, is 55 to 60%. The left ventricle has normal function. The  left ventricle has no regional wall motion abnormalities. There is mild  concentric left ventricular  hypertrophy. Left ventricular diastolic parameters are consistent with  Grade I diastolic dysfunction (impaired relaxation).   2. Right ventricular systolic function is normal. The right ventricular  size is normal. There is normal pulmonary artery systolic pressure.   3. The mitral valve is degenerative. No evidence of mitral valve  regurgitation. No evidence of mitral stenosis.   4. The aortic valve is normal in structure. Aortic valve regurgitation is  mild. Mild aortic valve  sclerosis is present, with no evidence of aortic  valve stenosis.   5. The inferior vena cava is normal in size with greater than 50%  respiratory variability, suggesting right atrial pressure of 3 mmHg.   FINDINGS   Left Ventricle: Vertical orientation of the LV. Left ventricular ejection  fraction, by estimation, is 55 to 60%. The left ventricle has normal  function. The left ventricle has no regional wall motion abnormalities.  Global longitudinal strain performed  but not reported based on interpreter judgement due to suboptimal  tracking. The left ventricular internal cavity size was normal in size.  There is mild concentric left ventricular hypertrophy. Left ventricular  diastolic parameters are consistent with  Grade I diastolic dysfunction (impaired relaxation). Normal left  ventricular filling pressure.   Right Ventricle: The right ventricular size is normal. No increase in  right ventricular wall thickness. Right ventricular systolic function is  normal. There is normal pulmonary artery systolic pressure. The tricuspid  regurgitant velocity is 2.38 m/s, and   with an assumed right atrial pressure of 3 mmHg, the estimated right  ventricular systolic pressure is 25.7 mmHg.   Left Atrium: Left atrial size was normal in size.   Right Atrium: Right atrial size was normal in size.   Pericardium: There is no evidence of pericardial effusion.   Mitral Valve: The mitral valve is degenerative in appearance. Mild mitral  annular calcification. No evidence of mitral valve regurgitation. No  evidence of mitral valve stenosis.   Tricuspid Valve: The tricuspid valve is normal in structure. Tricuspid  valve regurgitation is trivial. No evidence of tricuspid stenosis.   Aortic Valve: The aortic valve is normal in structure. Aortic valve  regurgitation is mild. Aortic regurgitation PHT measures 533 msec. Mild  aortic valve sclerosis is present, with no evidence of aortic valve   stenosis.   Pulmonic Valve: The pulmonic valve was normal in structure. Pulmonic valve  regurgitation is not visualized. No evidence of pulmonic stenosis.   Aorta: The aortic root and ascending aorta are structurally normal, with  no evidence of dilitation and the aortic arch was not well visualized.   Venous: The pulmonary veins were not well visualized. The inferior vena  cava is normal in size with greater than 50% respiratory variability,  suggesting right atrial pressure of 3 mmHg.   IAS/Shunts: No atrial level shunt detected by color flow Doppler.    LHC 11/01/2020  Prox RCA lesion is 30% stenosed.   Mid RCA lesion is 20% stenosed.   2nd Mrg lesion is 20% stenosed.   Mid LAD lesion is 100% stenosed.  There is mild left ventricular systolic dysfunction.   LV end diastolic pressure is normal.   The left ventricular ejection fraction is 45-50% by visual estimate.   There is no mitral valve regurgitation.   The LAD is a large caliber vessel that courses to the apex. The LAD is occluded beyond the takeoff of the large diagonal branch. The LAD is diffusely diseased beyond the chronic occlusion and fills from left to left collaterals.  Mild plaque in the Circumflex The RCA is a large dominant vessel with mild aneurysmal dilation of the proximal vessel, mild plaque.  LVEF 45% with mild anterior wall motion abnormality  Recommendations: Medical management of CAD. The LAD is not a favorable target for PCI. The vessel fills from collaterals. Consider anti-anginal therapy. Echo pending  Recent Labs: 04/24/2022: ALT 10 11/03/2022: BUN 20; Creatinine, Ser 1.01; Hemoglobin 16.2; Magnesium 2.5; Platelets 241; Potassium 5.0; Sodium 143  Recent Lipid Panel    Component Value Date/Time   CHOL 144 04/24/2022 1039   TRIG 159 (H) 04/24/2022 1039   HDL 38 (L) 04/24/2022 1039   CHOLHDL 3.8 04/24/2022 1039   CHOLHDL 5.8 CALC 10/21/2007 0849   VLDL 33 10/21/2007 0849   LDLCALC 78 04/24/2022  1039    Physical Exam:    VS:  BP 120/72 (BP Location: Left Arm, Patient Position: Sitting, Cuff Size: Normal)   Pulse 65   Ht 5\' 6"  (1.676 m)   Wt 178 lb 3.2 oz (80.8 kg)   SpO2 98%   BMI 28.76 kg/m     Wt Readings from Last 3 Encounters:  01/31/23 178 lb 3.2 oz (80.8 kg)  01/10/23 176 lb 9.6 oz (80.1 kg)  12/20/22 176 lb (79.8 kg)     GEN: Well nourished, well developed in no acute distress HEENT: Normal NECK: No JVD; No carotid bruits LYMPHATICS: No lymphadenopathy CARDIAC: S1S2 noted,RRR, no murmurs, rubs, gallops RESPIRATORY:  Clear to auscultation without rales, wheezing or rhonchi  ABDOMEN: Soft, non-tender, non-distended, +bowel sounds, no guarding. EXTREMITIES: No edema, No cyanosis, no clubbing MUSCULOSKELETAL:  No deformity  SKIN: Warm and dry NEUROLOGIC:  Alert and oriented x 3, non-focal PSYCHIATRIC:  Normal affect, good insight  ASSESSMENT:    1. Hyperlipidemia, unspecified hyperlipidemia type   2. Screening for diabetes mellitus (DM)   3. Coronary artery disease involving native coronary artery of native heart, unspecified whether angina present   4. Essential (primary) hypertension   5. Tobacco use     PLAN:    CAD - PET scan showed small area of ischemia - low risk study. Intermittent chest pain reported, but currently not on optimal antianginal. -Optimize angina management before considering heart catheterization.  She is on Imdur 15 mg daily because she does not tolerate anything greater than 30 mg, but this I am going to add Ranexa 500 mg twice a day.   Hyperlipidemia LDL currently at 70. -Increase Atorvastatin to 40mg  daily with a goal LDL of less than 55. -Plan to recheck lipid profile in 12 weeks. If LDL goal not achieved, consider adding another lipid-lowering agent or PCSK9 inhibitors.  Chronic Venous Insufficiency Reports of persistent leg swelling, worse at the end of the day. -Monitor leg swelling. If it continues to worsen, consider  adding Lasix once a week.  The patient was counseled on tobacco cessation today for 5 minutes.  Counseling included reviewing the risks of smoking tobacco products, how it impacts the patient's current medical diagnoses and different strategies for quitting.  Pharmacotherapy to  aid in tobacco cessation was not prescribed today. The patient coordinate with  primary care provider.  The patient was also advised to call   1-800-QUIT-NOW (910-666-4372) for additional help with quitting smoking.  General Health Maintenance -Order blood work today to check lipid profile. -Follow-up appointment in 12 weeks.   Will get blood work for [employer], mag as well as lipid profile.   The patient is in agreement with the above plan. The patient left the office in stable condition.  The patient will follow up in 6 months   Medication Adjustments/Labs and Tests Ordered: Current medicines are reviewed at length with the patient today.  Concerns regarding medicines are outlined above.  Orders Placed This Encounter  Procedures   Lipid panel   Hemoglobin A1c   Meds ordered this encounter  Medications   nitroGLYCERIN (NITROSTAT) 0.4 MG SL tablet    Sig: Place 1 tablet (0.4 mg total) under the tongue every 5 (five) minutes as needed for chest pain.    Dispense:  25 tablet    Refill:  3   isosorbide mononitrate (IMDUR) 30 MG 24 hr tablet    Sig: Take 0.5 tablets (15 mg total) by mouth daily.    Dispense:  45 tablet    Refill:  3   ranolazine (RANEXA) 500 MG 12 hr tablet    Sig: Take 1 tablet (500 mg total) by mouth 2 (two) times daily.    Dispense:  180 tablet    Refill:  3   atorvastatin (LIPITOR) 40 MG tablet    Sig: Take 1 tablet (40 mg total) by mouth daily.    Dispense:  90 tablet    Refill:  3    Increased dose    Patient Instructions  Medication Instructions:  Your physician has recommended you make the following change in your medication:  START: Ranexa 500 mg twice daily INCREASE: Lipitor  40 mg once daily  *If you need a refill on your cardiac medications before your next appointment, please call your pharmacy*   Lab Work: Lipids, HgbA1c If you have labs (blood work) drawn today and your tests are completely normal, you will receive your results only by: MyChart Message (if you have MyChart) OR A paper copy in the mail If you have any lab test that is abnormal or we need to change your treatment, we will call you to review the results.   Follow-Up: At [facility], you and your health needs are our priority.  As part of our continuing mission to provide you with exceptional heart care, we have created designated Provider Care Teams.  These Care Teams include your primary Cardiologist (physician) and Advanced Practice Providers (APPs -  Physician Assistants and Nurse Practitioners) who all work together to provide you with the care you need, when you need it.  We recommend signing up for the patient portal called "MyChart".  Sign up information is provided on this After Visit Summary.  MyChart is used to connect with patients for Virtual Visits (Telemedicine).  Patients are able to view lab/test results, encounter notes, upcoming appointments, etc.  Non-urgent messages can be sent to your provider as well.   To learn more about what you can do with MyChart, go to [email or web address].    Your next appointment:   12 week(s)  Provider:   Thomasene Ripple, DO     Adopting a Healthy Lifestyle.  Know what a healthy weight is for you (roughly BMI <25) and aim to maintain this  Aim for 7+ servings of fruits and vegetables daily   65-80+ fluid ounces of water or unsweet tea for healthy kidneys   Limit to max 1 drink of alcohol per day; avoid smoking/tobacco   Limit animal fats in diet for cholesterol and heart health - choose grass fed whenever available   Avoid highly processed foods, and foods high in saturated/trans fats   Aim for low stress - take time to  unwind and care for your mental health   Aim for 150 min of moderate intensity exercise weekly for heart health, and weights twice weekly for bone health   Aim for 7-9 hours of sleep daily   When it comes to diets, agreement about the perfect plan isnt easy to find, even among the experts. Experts at the [facility] of [employer] developed an idea known as the Healthy Eating Plate. Just imagine a plate divided into logical, healthy portions.   The emphasis is on diet quality:   Load up on vegetables and fruits - one-half of your plate: Aim for color and variety, and remember that potatoes dont count.   Go for whole grains - one-quarter of your plate: Whole wheat, barley, wheat berries, quinoa, oats, brown rice, and foods made with them. If you want pasta, go with whole wheat pasta.   Protein power - one-quarter of your plate: Fish, chicken, beans, and nuts are all healthy, versatile protein sources. Limit red meat.   The diet, however, does go beyond the plate, offering a few other suggestions.   Use healthy plant oils, such as olive, canola, soy, corn, sunflower and peanut. Check the labels, and avoid partially hydrogenated oil, which have unhealthy trans fats.   If youre thirsty, drink water. Coffee and tea are good in moderation, but skip sugary drinks and limit milk and dairy products to one or two daily servings.   The type of carbohydrate in the diet is more important than the amount. Some sources of carbohydrates, such as vegetables, fruits, whole grains, and beans-are healthier than others.   Finally, stay active  Signed, Thomasene Ripple, DO  01/31/2023 10:36 AM    [facility]

## 2023-01-31 NOTE — Patient Instructions (Signed)
Medication Instructions:  Your physician has recommended you make the following change in your medication:  START: Ranexa 500 mg twice daily INCREASE: Lipitor 40 mg once daily  *If you need a refill on your cardiac medications before your next appointment, please call your pharmacy*   Lab Work: Lipids, HgbA1c If you have labs (blood work) drawn today and your tests are completely normal, you will receive your results only by: MyChart Message (if you have MyChart) OR A paper copy in the mail If you have any lab test that is abnormal or we need to change your treatment, we will call you to review the results.   Follow-Up: At [facility], you and your health needs are our priority.  As part of our continuing mission to provide you with exceptional heart care, we have created designated Provider Care Teams.  These Care Teams include your primary Cardiologist (physician) and Advanced Practice Providers (APPs -  Physician Assistants and Nurse Practitioners) who all work together to provide you with the care you need, when you need it.  We recommend signing up for the patient portal called "MyChart".  Sign up information is provided on this After Visit Summary.  MyChart is used to connect with patients for Virtual Visits (Telemedicine).  Patients are able to view lab/test results, encounter notes, upcoming appointments, etc.  Non-urgent messages can be sent to your provider as well.   To learn more about what you can do with MyChart, go to [email or web address].    Your next appointment:   12 week(s)  Provider:   Thomasene Ripple, DO

## 2023-02-01 ENCOUNTER — Other Ambulatory Visit (HOSPITAL_COMMUNITY): Payer: Self-pay

## 2023-02-01 ENCOUNTER — Telehealth: Payer: Self-pay | Admitting: Cardiology

## 2023-02-01 LAB — HEMOGLOBIN A1C
Est. average glucose Bld gHb Est-mCnc: 103 mg/dL
Hgb A1c MFr Bld: 5.2 % (ref 4.8–5.6)

## 2023-02-01 LAB — LIPID PANEL
Chol/HDL Ratio: 3.6 ratio (ref 0.0–4.4)
Cholesterol, Total: 147 mg/dL (ref 100–199)
HDL: 41 mg/dL (ref >39)
LDL Chol Calc (NIH): 78 mg/dL (ref 0–99)
Triglycerides: 165 mg/dL — ABNORMAL HIGH (ref 0–149)
VLDL Cholesterol Cal: 28 mg/dL (ref 5–40)

## 2023-02-01 NOTE — Telephone Encounter (Signed)
Looks like the pharmacy filled it yesterday. Refill is too soon.

## 2023-02-01 NOTE — Telephone Encounter (Signed)
Patient walked to clinic with concerns about her Ranexa 500 mg. She stated that the medication is to expensive and she can not afford it. She is requesting replacement medication. Attempted to contact patient with no answer. Left message to return call.

## 2023-02-02 NOTE — Telephone Encounter (Signed)
Pt is returning a call to a nurse

## 2023-02-02 NOTE — Telephone Encounter (Signed)
Patient identification verified by 2 forms. Marilynn Rail, RN    Called and spoke to patient  Informed patient message was sent to provider/pharmacy awaiting for response  Patient states:    -she will also attempt to apply for patient assistance   -once she received application she will bring it to office for Dr. Servando Salina to complete   -if it is approved it may only be for a 1 month prescription   -would like provider in the meantime to see if there is a cheaper alternative  Informed patient once update/response available she will be outreached

## 2023-02-05 NOTE — Telephone Encounter (Signed)
Called and spoke to patient and below message relayed. Patient stated she still will not be able to get medication until February due to upcoming expenses. She reports that she did get the Fish oil and the Atorvastatin that was increase. She is waiting to hear from the manufacture for assistance.    Rosalee Kaufman, RPH-CPP  Pharmacist Specialty: Dala Dock - looking at her Medicare plan, she has a $255 deductible that applies to this medication.  The first prescription will be more expensive, after that looks like for 2025 she will pay $47/month until she hits the coverage gap.  Won't have the coverage gap costs until late December when the info is released.

## 2023-02-05 NOTE — Telephone Encounter (Signed)
FYI - looking at her Medicare plan, she has a $255 deductible that applies to this medication.  The first prescription will be more expensive, after that looks like for 2025 she will pay $47/month until she hits the coverage gap.  Won't have the coverage gap costs until late December when the info is released.

## 2023-02-19 DIAGNOSIS — H524 Presbyopia: Secondary | ICD-10-CM | POA: Diagnosis not present

## 2023-02-19 DIAGNOSIS — H2513 Age-related nuclear cataract, bilateral: Secondary | ICD-10-CM | POA: Diagnosis not present

## 2023-02-22 ENCOUNTER — Telehealth: Payer: Self-pay

## 2023-02-22 NOTE — Telephone Encounter (Signed)
LMOVM for patient, provider portion of assistance form for [employer] received. Wanted to make sure patient portion filled out and sent over to company. If not may attach to a mychart message or bring to office to be faxed over with provider portion.
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
## 2023-02-26 ENCOUNTER — Encounter: Payer: Self-pay | Admitting: Neurology

## 2023-02-26 ENCOUNTER — Ambulatory Visit: Payer: Medicare Other | Admitting: Neurology

## 2023-02-26 ENCOUNTER — Other Ambulatory Visit: Payer: Medicare Other

## 2023-02-26 ENCOUNTER — Telehealth: Payer: Self-pay | Admitting: Cardiology

## 2023-02-26 VITALS — BP 121/71 | HR 62 | Ht 66.0 in | Wt 174.0 lb

## 2023-02-26 DIAGNOSIS — R413 Other amnesia: Secondary | ICD-10-CM | POA: Diagnosis not present

## 2023-02-26 DIAGNOSIS — G43009 Migraine without aura, not intractable, without status migrainosus: Secondary | ICD-10-CM

## 2023-02-26 DIAGNOSIS — R519 Headache, unspecified: Secondary | ICD-10-CM

## 2023-02-26 NOTE — Patient Instructions (Addendum)
Plant to stop Aimovig and start Vyepti 300mg  IV every 3 months Continue topiramate 200mg  twice daily Limit use of pain relievers to no more than 2 days out of week to prevent risk of rebound or medication-overuse headache. Keep headache diary Check CTA head. We have sent a referral to [facility] for your MRI and they will call you directly to schedule your appointment. They are located at [address] [facility]. If you need to contact them directly please call [phone].  Check B12 and TSH Follow up for memory testing next available (must be here 30 minutes early for testing) Follow up otherwise in 7 months

## 2023-02-26 NOTE — Telephone Encounter (Signed)
Tobb, Kardie, DO  You; Lars Pinks, Jasmine M, RN8 minutes ago (4:36 PM)   There is no plan for a CT scan - she had positive cardiac PET but wanted to wait before getting the Cardiac cath. I just spoke with the patient. She had a clear understanding of the plan now  _______________________________________  Noted  No further nursing outreach needed at this time

## 2023-02-26 NOTE — Telephone Encounter (Signed)
Pt called in stating Dr. Servando Salina wanted her to have a CT done. She states she is scheduled for a CT of her brain 03/15/23 and she would like have them done the same day so she only has to pay one deductible. Please advise if Dr. Servando Salina wants this done, did not see an order for it.

## 2023-02-27 ENCOUNTER — Other Ambulatory Visit: Payer: Self-pay

## 2023-02-27 ENCOUNTER — Telehealth: Payer: Self-pay

## 2023-02-27 ENCOUNTER — Encounter: Payer: Self-pay | Admitting: Psychology

## 2023-02-27 LAB — VITAMIN B12: Vitamin B-12: 978 pg/mL (ref 200–1100)

## 2023-02-27 LAB — TSH: TSH: 1.01 m[IU]/L (ref 0.40–4.50)

## 2023-02-27 NOTE — Telephone Encounter (Signed)
ERROR

## 2023-02-28 ENCOUNTER — Ambulatory Visit: Payer: Medicare Other | Admitting: Psychology

## 2023-02-28 ENCOUNTER — Telehealth: Payer: Self-pay | Admitting: Neurology

## 2023-02-28 ENCOUNTER — Encounter: Payer: Self-pay | Admitting: Psychology

## 2023-02-28 ENCOUNTER — Telehealth: Payer: Self-pay | Admitting: Pharmacy Technician

## 2023-02-28 DIAGNOSIS — F411 Generalized anxiety disorder: Secondary | ICD-10-CM

## 2023-02-28 DIAGNOSIS — F410 Panic disorder [episodic paroxysmal anxiety] without agoraphobia: Secondary | ICD-10-CM

## 2023-02-28 DIAGNOSIS — F330 Major depressive disorder, recurrent, mild: Secondary | ICD-10-CM

## 2023-02-28 DIAGNOSIS — T1490XA Injury, unspecified, initial encounter: Secondary | ICD-10-CM

## 2023-02-28 DIAGNOSIS — S069XAS Unspecified intracranial injury with loss of consciousness status unknown, sequela: Secondary | ICD-10-CM | POA: Diagnosis not present

## 2023-02-28 DIAGNOSIS — F09 Unspecified mental disorder due to known physiological condition: Secondary | ICD-10-CM | POA: Diagnosis not present

## 2023-02-28 DIAGNOSIS — R4189 Other symptoms and signs involving cognitive functions and awareness: Secondary | ICD-10-CM

## 2023-02-28 NOTE — Progress Notes (Unsigned)
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 leave a significant number of items blank or unanswered which did impact scoring validity and interpretations. A screening instrument assessing recent sleep quality suggested the presence of severe sleep dysfunction.  Tables of Scores:   Note: This summary of test scores accompanies the interpretive report and should not be considered in isolation without reference to the appropriate sections in the text. Descriptors are based on appropriate normative data and may be adjusted based on clinical judgment. Terms such as "Within Normal Limits" and "Outside Normal Limits" are used when a more specific description of the test score cannot be determined.       Percentile - Normative Descriptor > 98 - Exceptionally High 91-97 - Well Above Average 75-90 - Above Average 25-74 - Average 9-24 - Below Average 2-8 - Well Below Average < 2 - Exceptionally Low       Validity:   DESCRIPTOR       TOMM: --- --- Within Normal Limits  ACS WC: --- --- Within Normal Limits  DCT: --- --- Within Normal Limits  NAB EVI: --- --- Within Normal Limits       Orientation:      Raw Score Percentile   NAB Orientation, Form 1 29/29 --- ---       Cognitive Screening:      Raw  Score Percentile   SLUMS: 23/30 --- ---       Intellectual Functioning:      Standard Score Percentile   Test of Premorbid Functioning: 87 19 Below Average       Memory:     NAB Memory Module, Form 1: T Score Percentile   List Learning       Total Trials 1-3 26/36 (56) 73 Average    List B 6/12 (59) 82 Above Average    Short Delay Free Recall 8/12 (53) 62 Average    Long Delay Free Recall 11/12 (69) 97 Well Above Average    Retention Percentage 138 (64) 92 Well Above Average    Recognition Discriminability 12 (63) 91 Well Above Average  Story Learning       Immediate Recall 54/80 (41) 18 Below Average    Delayed Recall 23/40 (35) 7 Well Below Average    Retention Percentage 79 (45) 31 Average        Raw Score (Scaled Score) Percentile   RBANS Figure Copy: 20/20 (14) 91 Well Above Average  RBANS Figure Recall: 16/20 (12) 75 Above Average       Attention/Executive Function:     Trail Making Test (TMT): Raw Score (T Score) Percentile     Part A 50 secs.,  0 errors (38) 12 Below Average    Part B 108 secs.,  0 errors (40) 16 Below Average         Scaled Score Percentile   WAIS-IV Coding: 8 25 Average       NAB Attention Module, Form 1: T Score Percentile     Digits Forward 40 16 Below Average    Digits Backwards 43 25 Average       D-KEFS Verbal Fluency Test: Raw Score (Scaled Score)  Percentile     Letter Total Correct 23 (6) 9 Below Average    Category Total Correct 30 (8) 25 Average    Category Switching Total Correct 11 (8) 25 Average    Category Switching Accuracy 9 (8) 25 Average      Total Set Loss Errors 1 (11) 63 Average      Total Repetition Errors 3 (10) 50 Average       Language:     Verbal Fluency Test: Raw Score (T Score) Percentile     Phonemic Fluency (FAS) 23 (34) 5 Well Below Average    Animal Fluency 14 (38) 12 Below Average        NAB Language Module, Form 1: T Score Percentile     Naming 31/31 (57) 75 Above Average        Visuospatial/Visuoconstruction:      Raw Score Percentile   Clock Drawing: 10/10 --- Within Normal Limits        Scaled Score Percentile   WAIS-IV Block Design: 10 50 Average  WAIS-IV Matrix Reasoning: 6 9 Below Average       Sensory-Motor:     [place] Grooved Pegboard Test: Raw Score Percentile     Dominant Hand 114 secs.,  0 drops  5 Well Below Average    Non-Dominant Hand 119 secs.,  1 drop 5 Well Below Average       Finger Tapping Test: Mean Percentile     Dominant Hand 40 34 Average    Non-Dominant Hand 40 46 Average       Mood and Personality:      Raw Score Percentile   Beck Depression Inventory - II: 15 --- Mild  PROMIS Anxiety Questionnaire: 21 --- Moderate       Personality Assessment Inventory-SF: T Score  Percentile     Inconsistency --- --- ---    Infrequency 44 --- Within Normal Limits    Negative Impression 44 --- Within Normal Limits    Positive Impression 34 --- Within Normal Limits    Somatic Complaints 56 --- Within Normal Limits    Anxiety 47 --- Within Normal Limits    Anxiety-Related Disorders 39 --- Within Normal Limits    Depression 49 --- Within Normal Limits    Mania 29 --- Within Normal Limits    Paranoia 36 --- Within Normal Limits    Schizophrenia 37 --- Within Normal Limits    Borderline Features 41 --- Within Normal Limits    Antisocial Features 39 --- Within Normal Limits    Alcohol Problems 41 --- Within Normal Limits    Drug Problems 48 --- Within Normal Limits    Aggression 35 --- Within Normal Limits    Suicidal Ideation 43 --- Within Normal Limits    Stress --- --- ---    Non Support 48 --- Within Normal Limits    Treatment Rejection 31 --- Within Normal Limits    Dominance 20 --- Within Normal Limits    Warmth 17 --- Within Normal Limits       Additional Questionnaires:      Raw Score Percentile   PROMIS Sleep Disturbance Questionnaire: 39 --- Severe   Informed Consent and Coding/Compliance:   The current evaluation  represents a clinical evaluation for the purposes previously outlined by the referral source and is in no way reflective of a forensic evaluation.   Ms. Sarsour was provided with a verbal description of the nature and purpose of the present neuropsychological evaluation. Also reviewed were the  foreseeable risks and/or discomforts and benefits of the procedure, limits of confidentiality, and mandatory reporting requirements of this provider. The patient was given the opportunity to ask questions and receive answers about the evaluation. Oral consent to participate was provided by the patient.   This evaluation was conducted by Newman Nickels, Ph.D., ABPP-CN, board certified clinical neuropsychologist. Ms. Doster completed a clinical interview with Dr. Milbert Coulter, billed as one unit [phone], and 160 minutes of cognitive testing and scoring, billed as one unit [phone] and four additional units [id number]. Psychometrist Wallace Keller, B.S. assisted Dr. Milbert Coulter with test administration and scoring procedures. As a separate and discrete service, one unit [id number] and two units [phone] were billed for Dr. Tammy Sours time spent in interpretation and report writing.

## 2023-02-28 NOTE — Telephone Encounter (Signed)
Dr. Everlena Cooper / Zenon Mayo,  Vypeti has been denied due to patient has not failed and or tried x2 preferred medications. Emaglity Aimovig.  We d/c the treatment plan.  @[employer]  - please d/c the treatment plan.  Auth Submission: DENIED Site of care: Site of care: [facility] Payer: [employer] Medication & CPT/J Code(s) submitted: Vyepti (Eptinezumab) [phone] Route of submission (phone, fax, portal): PORTAL Phone # Fax # Auth type: Buy/Bill PB Units/visits requested:  Reference number: [id number] Approval from:   Authorization has been DENIED because x2 preferred meds has not been failed/tried.

## 2023-02-28 NOTE — Telephone Encounter (Signed)
Pt came into the office today at 1:00 for testing with Milbert Coulter today. She states that her ins is not approving the medication that Dr Everlena Cooper wants her to do every 3 months for her migraines. She states that she does not know the name and it is by a IV  please call

## 2023-02-28 NOTE — Progress Notes (Signed)
   Psychometrician Note   Cognitive testing was administered to [employer] by Wallace Keller, B.S. (psychometrist) under the supervision of Dr. Newman Nickels, Ph.D., licensed psychologist on 02/28/2023. [name] did not appear overtly distressed by the testing session per behavioral observation or responses across self-report questionnaires. Rest breaks were offered.    The battery of tests administered was selected by Dr. Newman Nickels, Ph.D. with consideration to [name]'s current level of functioning, the nature of her symptoms, emotional and behavioral responses during interview, level of literacy, observed level of motivation/effort, and the nature of the referral question. This battery was communicated to the psychometrist. Communication between Dr. Newman Nickels, Ph.D. and the psychometrist was ongoing throughout the evaluation and Dr. Newman Nickels, Ph.D. was immediately accessible at all times. Dr. Newman Nickels, Ph.D. provided supervision to the psychometrist on the date of this service to the extent necessary to assure the quality of all services provided.    [name] will return within approximately 1-2 weeks for an interactive feedback session with Dr. Milbert Coulter at which time her test performances, clinical impressions, and treatment recommendations will be reviewed in detail. [name] understands she can contact our office should she require our assistance before this time.  A total of 160 minutes of billable time were spent face-to-face with [name] by the psychometrist. This includes both test administration and scoring time. Billing for these services is reflected in the clinical report generated by Dr. Newman Nickels, Ph.D.  This note reflects time spent with the psychometrician and does not include test scores or any clinical interpretations made by Dr. Milbert Coulter. The full report will follow in a separate note.

## 2023-03-01 ENCOUNTER — Encounter: Payer: Self-pay | Admitting: Psychology

## 2023-03-01 DIAGNOSIS — F329 Major depressive disorder, single episode, unspecified: Secondary | ICD-10-CM | POA: Insufficient documentation

## 2023-03-01 DIAGNOSIS — F410 Panic disorder [episodic paroxysmal anxiety] without agoraphobia: Secondary | ICD-10-CM | POA: Insufficient documentation

## 2023-03-01 DIAGNOSIS — T1490XA Injury, unspecified, initial encounter: Secondary | ICD-10-CM | POA: Insufficient documentation

## 2023-03-01 NOTE — Telephone Encounter (Signed)
Patient to come by and sign form for Aimovig.

## 2023-03-01 NOTE — Telephone Encounter (Signed)
Patient advised of DR.Jaffe note, Please contact patient to let her know that she needs to try another monthly injection before being approved for Aimovig.  If agreeable, send in prescription for Emgality - first dose requires 2 injections (2 pens).  Send in for 240mg  once.  LOADING DOSE.  Quantity 2 mL.  Refills 0.  She must make sure she has 2 pens and contact us to let us know, so that we can send in the standing order of 1 injection every 28 days.     Per patient she get Aimovig  already through

## 2023-03-02 ENCOUNTER — Other Ambulatory Visit: Payer: Self-pay | Admitting: Neurology

## 2023-03-02 MED ORDER — AIMOVIG 140 MG/ML ~~LOC~~ SOAJ: 140.0000 mg | SUBCUTANEOUS | 11 refills | Status: DC

## 2023-03-02 NOTE — Telephone Encounter (Signed)
Patient advised on 12/19 of this and she stated she wanted to go back to Aimovig since she can get it through the company.

## 2023-03-05 NOTE — Telephone Encounter (Signed)
Patient advised,  Prescription for Aimovig 140mg  has been sent to [facility]

## 2023-03-09 ENCOUNTER — Telehealth: Payer: Self-pay | Admitting: Neurology

## 2023-03-09 NOTE — Telephone Encounter (Signed)
Pt called in stating she got a letter that her insurance didn't approve the Vyepti medication. She says they did approve the avsola. The insurance also needs a "prescription authorization" for Aimovig.

## 2023-03-13 ENCOUNTER — Ambulatory Visit: Payer: Medicare Other | Admitting: Psychology

## 2023-03-13 DIAGNOSIS — F411 Generalized anxiety disorder: Secondary | ICD-10-CM

## 2023-03-13 DIAGNOSIS — F410 Panic disorder [episodic paroxysmal anxiety] without agoraphobia: Secondary | ICD-10-CM

## 2023-03-13 DIAGNOSIS — G43009 Migraine without aura, not intractable, without status migrainosus: Secondary | ICD-10-CM

## 2023-03-13 DIAGNOSIS — T1490XA Injury, unspecified, initial encounter: Secondary | ICD-10-CM

## 2023-03-13 DIAGNOSIS — F09 Unspecified mental disorder due to known physiological condition: Secondary | ICD-10-CM | POA: Diagnosis not present

## 2023-03-13 DIAGNOSIS — S069XAS Unspecified intracranial injury with loss of consciousness status unknown, sequela: Secondary | ICD-10-CM

## 2023-03-13 NOTE — Telephone Encounter (Signed)
Advised patient twice already spoke on the denial and to send a script for Aimovig to the company for Assistance.
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
## 2023-03-15 ENCOUNTER — Ambulatory Visit
Admission: RE | Admit: 2023-03-15 | Discharge: 2023-03-15 | Disposition: A | Discharge status: Home / Self Care | Payer: Medicare Other | Source: Ambulatory Visit | Attending: Neurology | Admitting: Neurology

## 2023-03-15 DIAGNOSIS — R519 Headache, unspecified: Secondary | ICD-10-CM | POA: Diagnosis not present

## 2023-03-15 DIAGNOSIS — G43009 Migraine without aura, not intractable, without status migrainosus: Secondary | ICD-10-CM

## 2023-03-15 MED ORDER — IOPAMIDOL (ISOVUE-370) INJECTION 76%
500.0000 mL | Freq: Once | INTRAVENOUS | Status: AC | PRN
  Administered 2023-03-15: 65 mL via INTRAVENOUS

## 2023-03-15 NOTE — Telephone Encounter (Signed)
 Letter received from [employer] Patient approved for [employer].

## 2023-03-22 ENCOUNTER — Ambulatory Visit (INDEPENDENT_AMBULATORY_CARE_PROVIDER_SITE_OTHER): Payer: Medicare Other | Admitting: Family Medicine

## 2023-03-22 VITALS — BP 92/58 | HR 56 | Ht 66.0 in | Wt 178.0 lb

## 2023-03-22 DIAGNOSIS — G4700 Insomnia, unspecified: Secondary | ICD-10-CM | POA: Diagnosis not present

## 2023-03-22 DIAGNOSIS — I10 Essential (primary) hypertension: Secondary | ICD-10-CM | POA: Diagnosis not present

## 2023-03-22 DIAGNOSIS — G43009 Migraine without aura, not intractable, without status migrainosus: Secondary | ICD-10-CM | POA: Diagnosis not present

## 2023-03-22 DIAGNOSIS — S069XAS Unspecified intracranial injury with loss of consciousness status unknown, sequela: Secondary | ICD-10-CM | POA: Diagnosis not present

## 2023-03-22 MED ORDER — TOPIRAMATE 100 MG PO TABS: 200.0000 mg | ORAL_TABLET | Freq: Two times a day (BID) | ORAL | 1 refills | Status: DC
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
## 2023-03-25 ENCOUNTER — Encounter: Payer: Self-pay | Admitting: Family Medicine

## 2023-03-25 NOTE — Assessment & Plan Note (Signed)
Blood pressure is well-controlled at this time.  She will continue current medications.

## 2023-03-25 NOTE — Assessment & Plan Note (Signed)
Continue trazodone at current strength.   

## 2023-03-25 NOTE — Assessment & Plan Note (Signed)
 She continues to do well with topiramate.  Aimovig added back on by neurology.  Overall stable at this time.

## 2023-03-25 NOTE — Assessment & Plan Note (Signed)
 She is followed by neurology and had recent neuropsychiatry evaluation.  She will continue to follow with  neurology for management.

## 2023-04-13 ENCOUNTER — Ambulatory Visit: Payer: Medicare Other | Admitting: Neurology

## 2023-04-13 NOTE — Progress Notes (Signed)
[name] has been identified as a patient that could benefit from health coaching for healthy eating and/or smoking cessation. Discuss with patient their interest in participating in the free health coaching program and refer to REF 2201/Care Navigation.

## 2023-04-16 ENCOUNTER — Encounter: Payer: Self-pay | Admitting: Cardiology

## 2023-04-16 ENCOUNTER — Ambulatory Visit: Payer: Medicare Other | Attending: Cardiology | Admitting: Cardiology

## 2023-04-16 VITALS — BP 126/80 | HR 60 | Ht 66.0 in | Wt 178.8 lb

## 2023-04-16 DIAGNOSIS — G4733 Obstructive sleep apnea (adult) (pediatric): Secondary | ICD-10-CM | POA: Diagnosis not present

## 2023-04-16 DIAGNOSIS — Z01812 Encounter for preprocedural laboratory examination: Secondary | ICD-10-CM

## 2023-04-16 DIAGNOSIS — I25118 Atherosclerotic heart disease of native coronary artery with other forms of angina pectoris: Secondary | ICD-10-CM

## 2023-04-16 DIAGNOSIS — E782 Mixed hyperlipidemia: Secondary | ICD-10-CM

## 2023-04-16 DIAGNOSIS — I471 Supraventricular tachycardia, unspecified: Secondary | ICD-10-CM | POA: Diagnosis not present

## 2023-04-16 DIAGNOSIS — Z79899 Other long term (current) drug therapy: Secondary | ICD-10-CM

## 2023-04-16 DIAGNOSIS — N1831 Chronic kidney disease, stage 3a: Secondary | ICD-10-CM

## 2023-04-16 DIAGNOSIS — I10 Essential (primary) hypertension: Secondary | ICD-10-CM | POA: Diagnosis not present

## 2023-04-16 NOTE — Patient Instructions (Addendum)
Medication Instructions:  Your physician recommends that you continue on your current medications as directed. Please refer to the Current Medication list given to you today.  *If you need a refill on your cardiac medications before your next appointment, please call your pharmacy*   Lab Work: CMET, Mag, CBC If you have labs (blood work) drawn today and your tests are completely normal, you will receive your results only by: MyChart Message (if you have MyChart) OR A paper copy in the mail If you have any lab test that is abnormal or we need to change your treatment, we will call you to review the results.   Testing/Procedures: You are scheduled for a Cardiac Catheterization on Thursday, February 6 with Dr. Peter [place].  1. Please arrive at the [facility] (Main Entrance A) at [facility]: [address] [place], [place] [address] at 7:00 AM (This time is 2 hour(s) before your procedure to ensure your preparation).   Free valet parking service is available. You will check in at ADMITTING. The support person will be asked to wait in the waiting room.  It is OK to have someone drop you off and come back when you are ready to be discharged.    Special note: Every effort is made to have your procedure done on time. Please understand that emergencies sometimes delay scheduled procedures.  2. Diet: Do not eat solid foods after midnight.  The patient may have clear liquids until 5am upon the day of the procedure.  3. Labs: You will need to have blood drawn on TODAY.  4. Medication instructions in preparation for your procedure:   Contrast Allergy: No  On the morning of your procedure, take your Aspirin 81 mg and any morning medicines NOT listed above.  You may use sips of water.  5. Plan to go home the same day, you will only stay overnight if medically necessary. 6. Bring a current list of your medications and current insurance cards. 7. You MUST have a responsible person to  drive you home. 8. Someone MUST be with you the first 24 hours after you arrive home or your discharge will be delayed. 9. Please wear clothes that are easy to get on and off and wear slip-on shoes.  Thank you for allowing [place] to care for you!   -- [facility]    Follow-Up: At [facility], you and your health needs are our priority.  As part of our continuing mission to provide you with exceptional heart care, we have created designated Provider Care Teams.  These Care Teams include your primary Cardiologist (physician) and Advanced Practice Providers (APPs -  Physician Assistants and Nurse Practitioners) who all work together to provide you with the care you need, when you need it.  Your next appointment:   2 week with APP then 12 week(s) (overbook okay)  Provider:   Thomasene Ripple, DO     Other Instructions:
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
## 2023-04-17 ENCOUNTER — Telehealth: Payer: Self-pay | Admitting: *Deleted

## 2023-04-17 LAB — CBC WITH DIFFERENTIAL/PLATELET
Basophils Absolute: 0.1 10*3/uL (ref 0.0–0.2)
Basos: 1 %
EOS (ABSOLUTE): 0.5 10*3/uL — ABNORMAL HIGH (ref 0.0–0.4)
Eos: 5 %
Hematocrit: 45.8 % (ref 34.0–46.6)
Hemoglobin: 15.3 g/dL (ref 11.1–15.9)
Immature Grans (Abs): 0 10*3/uL (ref 0.0–0.1)
Immature Granulocytes: 0 %
Lymphocytes Absolute: 2.6 10*3/uL (ref 0.7–3.1)
Lymphs: 28 %
MCH: 31.6 pg (ref 26.6–33.0)
MCHC: 33.4 g/dL (ref 31.5–35.7)
MCV: 95 fL (ref 79–97)
Monocytes Absolute: 0.7 10*3/uL (ref 0.1–0.9)
Monocytes: 7 %
Neutrophils Absolute: 5.5 10*3/uL (ref 1.4–7.0)
Neutrophils: 59 %
Platelets: 175 10*3/uL (ref 150–450)
RBC: 4.84 x10E6/uL (ref 3.77–5.28)
RDW: 12.5 % (ref 11.7–15.4)
WBC: 9.3 10*3/uL (ref 3.4–10.8)

## 2023-04-17 LAB — COMPREHENSIVE METABOLIC PANEL
ALT: 17 [IU]/L (ref 0–32)
AST: 15 [IU]/L (ref 0–40)
Albumin: 4.5 g/dL (ref 3.9–4.9)
Alkaline Phosphatase: 91 [IU]/L (ref 44–121)
BUN/Creatinine Ratio: 24 (ref 12–28)
BUN: 20 mg/dL (ref 8–27)
Bilirubin Total: 0.3 mg/dL (ref 0.0–1.2)
CO2: 22 mmol/L (ref 20–29)
Calcium: 10.4 mg/dL — ABNORMAL HIGH (ref 8.7–10.3)
Chloride: 107 mmol/L — ABNORMAL HIGH (ref 96–106)
Creatinine, Ser: 0.84 mg/dL (ref 0.57–1.00)
Globulin, Total: 2.2 g/dL (ref 1.5–4.5)
Glucose: 77 mg/dL (ref 70–99)
Potassium: 5.2 mmol/L (ref 3.5–5.2)
Sodium: 140 mmol/L (ref 134–144)
Total Protein: 6.7 g/dL (ref 6.0–8.5)
eGFR: 76 mL/min/{1.73_m2} (ref >59)

## 2023-04-17 LAB — MAGNESIUM: Magnesium: 2.4 mg/dL — ABNORMAL HIGH (ref 1.6–2.3)

## 2023-04-17 NOTE — Telephone Encounter (Signed)
 Cardiac Catheterization scheduled at [facility] for: Thursday April 19, 2023 9 AM Arrival time [facility] Main Entrance A at: 7 AM  Nothing to eat after midnight prior to procedure, clear liquids until 5 AM day of procedure.  Medication instructions: -Usual morning medications can be taken with sips of water including aspirin  81 mg.  Plan to go home the same day, you will only stay overnight if medically necessary.  You must have responsible adult to drive you home.  Someone must be with you the first 24 hours after you arrive home.  Reviewed procedure instructions with patient.

## 2023-04-19 ENCOUNTER — Other Ambulatory Visit: Payer: Self-pay

## 2023-04-19 ENCOUNTER — Encounter (HOSPITAL_COMMUNITY)
Admission: RE | Disposition: A | Discharge status: Home / Self Care | Payer: Self-pay | Source: Home / Self Care | Attending: Cardiology

## 2023-04-19 ENCOUNTER — Ambulatory Visit (HOSPITAL_COMMUNITY)
Admission: RE | Admit: 2023-04-19 | Discharge: 2023-04-19 | Disposition: A | Discharge status: Home / Self Care | Payer: Medicare Other | Attending: Cardiology | Admitting: Cardiology

## 2023-04-19 DIAGNOSIS — E782 Mixed hyperlipidemia: Secondary | ICD-10-CM | POA: Diagnosis not present

## 2023-04-19 DIAGNOSIS — I6523 Occlusion and stenosis of bilateral carotid arteries: Secondary | ICD-10-CM | POA: Diagnosis not present

## 2023-04-19 DIAGNOSIS — I2582 Chronic total occlusion of coronary artery: Secondary | ICD-10-CM | POA: Diagnosis not present

## 2023-04-19 DIAGNOSIS — I714 Abdominal aortic aneurysm, without rupture, unspecified: Secondary | ICD-10-CM | POA: Diagnosis not present

## 2023-04-19 DIAGNOSIS — G4733 Obstructive sleep apnea (adult) (pediatric): Secondary | ICD-10-CM | POA: Insufficient documentation

## 2023-04-19 DIAGNOSIS — N1831 Chronic kidney disease, stage 3a: Secondary | ICD-10-CM | POA: Insufficient documentation

## 2023-04-19 DIAGNOSIS — Z79899 Other long term (current) drug therapy: Secondary | ICD-10-CM | POA: Insufficient documentation

## 2023-04-19 DIAGNOSIS — I129 Hypertensive chronic kidney disease with stage 1 through stage 4 chronic kidney disease, or unspecified chronic kidney disease: Secondary | ICD-10-CM | POA: Diagnosis not present

## 2023-04-19 DIAGNOSIS — I2584 Coronary atherosclerosis due to calcified coronary lesion: Secondary | ICD-10-CM | POA: Diagnosis not present

## 2023-04-19 DIAGNOSIS — I25119 Atherosclerotic heart disease of native coronary artery with unspecified angina pectoris: Secondary | ICD-10-CM

## 2023-04-19 DIAGNOSIS — I471 Supraventricular tachycardia, unspecified: Secondary | ICD-10-CM | POA: Diagnosis not present

## 2023-04-19 DIAGNOSIS — Z853 Personal history of malignant neoplasm of breast: Secondary | ICD-10-CM | POA: Insufficient documentation

## 2023-04-19 DIAGNOSIS — F17210 Nicotine dependence, cigarettes, uncomplicated: Secondary | ICD-10-CM | POA: Diagnosis not present

## 2023-04-19 DIAGNOSIS — I25118 Atherosclerotic heart disease of native coronary artery with other forms of angina pectoris: Secondary | ICD-10-CM | POA: Insufficient documentation

## 2023-04-19 HISTORY — PX: LEFT HEART CATH AND CORONARY ANGIOGRAPHY: CATH118249

## 2023-04-19 SURGERY — LEFT HEART CATH AND CORONARY ANGIOGRAPHY
Anesthesia: LOCAL

## 2023-04-19 MED ORDER — IOHEXOL 350 MG/ML SOLN
INTRAVENOUS | Status: DC | PRN
  Administered 2023-04-19: 40 mL

## 2023-04-19 MED ORDER — LIDOCAINE HCL (PF) 1 % IJ SOLN
INTRAMUSCULAR | Status: DC | PRN
  Administered 2023-04-19: 2 mL

## 2023-04-19 MED ORDER — SODIUM CHLORIDE 0.9 % IV SOLN: INTRAVENOUS | Status: DC

## 2023-04-19 MED ORDER — VERAPAMIL HCL 2.5 MG/ML IV SOLN
INTRAVENOUS | Status: DC | PRN
  Administered 2023-04-19: 10 mL via INTRA_ARTERIAL

## 2023-04-19 MED ORDER — SODIUM CHLORIDE 0.9 % WEIGHT BASED INFUSION: 1.0000 mL/kg/h | INTRAVENOUS | Status: DC

## 2023-04-19 MED ORDER — ONDANSETRON HCL 4 MG/2ML IJ SOLN: 4.0000 mg | Freq: Four times a day (QID) | INTRAMUSCULAR | Status: DC | PRN

## 2023-04-19 MED ORDER — SODIUM CHLORIDE 0.9 % IV SOLN
INTRAVENOUS | Status: DC | PRN
  Administered 2023-04-19: 200 mL via INTRAVENOUS
  Administered 2023-04-19: 300 mL via INTRAVENOUS

## 2023-04-19 MED ORDER — SODIUM CHLORIDE 0.9% FLUSH: 3.0000 mL | Freq: Two times a day (BID) | INTRAVENOUS | Status: DC

## 2023-04-19 MED ORDER — ACETAMINOPHEN 325 MG PO TABS: 650.0000 mg | ORAL_TABLET | ORAL | Status: DC | PRN

## 2023-04-19 MED ORDER — ASPIRIN 81 MG PO CHEW: 81.0000 mg | CHEWABLE_TABLET | ORAL | Status: DC

## 2023-04-19 MED ORDER — SODIUM CHLORIDE 0.9% FLUSH: 3.0000 mL | INTRAVENOUS | Status: DC | PRN

## 2023-04-19 MED ORDER — HEPARIN SODIUM (PORCINE) 1000 UNIT/ML IJ SOLN
INTRAMUSCULAR | Status: DC | PRN
  Administered 2023-04-19: 4000 [IU] via INTRAVENOUS

## 2023-04-19 MED ORDER — SODIUM CHLORIDE 0.9 % IV SOLN: 250.0000 mL | INTRAVENOUS | Status: DC | PRN

## 2023-04-19 MED ORDER — SODIUM CHLORIDE 0.9 % WEIGHT BASED INFUSION: 3.0000 mL/kg/h | INTRAVENOUS | Status: AC

## 2023-04-19 SURGICAL SUPPLY — 10 items
CATH INFINITI JR4 5F (CATHETERS) IMPLANT
CATH LAUNCHER 5F EBU3.5 (CATHETERS) IMPLANT
DEVICE RAD COMP TR BAND LRG (VASCULAR PRODUCTS) IMPLANT
ELECT DEFIB PAD ADLT CADENCE (PAD) IMPLANT
GLIDESHEATH SLEND SS 6F .021 (SHEATH) IMPLANT
GUIDEWIRE INQWIRE 1.5J.035X260 (WIRE) IMPLANT
INQWIRE 1.5J .035X260CM (WIRE) ×1
PACK CARDIAC CATHETERIZATION (CUSTOM PROCEDURE TRAY) ×1 IMPLANT
SET ATX-X65L (MISCELLANEOUS) IMPLANT
SHEATH PROBE COVER 6X72 (BAG) IMPLANT

## 2023-04-19 NOTE — Progress Notes (Signed)
 TR BAND REMOVAL  LOCATION:    right radial  DEFLATED PER PROTOCOL:    Yes.    TIME BAND OFF / DRESSING APPLIED:    1045 gauze dressing applied   SITE UPON ARRIVAL:    Level 0  SITE AFTER BAND REMOVAL:    Level 0  CIRCULATION SENSATION AND MOVEMENT:    Within Normal Limits   Yes.    COMMENTS:   no issues noted

## 2023-04-19 NOTE — Discharge Instructions (Signed)

## 2023-04-19 NOTE — Interval H&P Note (Signed)
 History and Physical Interval Note:  04/19/2023 8:01 AM  [name]  has presented today for surgery, with the diagnosis of cad - angina.  The various methods of treatment have been discussed with the patient and family. After consideration of risks, benefits and other options for treatment, the patient has consented to  Procedure(s): LEFT HEART CATH AND CORONARY ANGIOGRAPHY (N/A) as a surgical intervention.  The patient's history has been reviewed, patient examined, no change in status, stable for surgery.  I have reviewed the patient's chart and labs.  Questions were answered to the patient's satisfaction.   Cath Lab Visit (complete for each Cath Lab visit)  Clinical Evaluation Leading to the Procedure:   ACS: No.  Non-ACS:    Anginal Classification: CCS II  Anti-ischemic medical therapy: Maximal Therapy (2 or more classes of medications)  Non-Invasive Test Results: Low-risk stress test findings: cardiac mortality <1%/year  Prior CABG: No previous CABG        [facility] 04/19/2023 8:01 AM

## 2023-04-20 ENCOUNTER — Encounter (HOSPITAL_COMMUNITY): Payer: Self-pay | Admitting: Cardiology

## 2023-05-01 ENCOUNTER — Encounter: Payer: Self-pay | Admitting: Physician Assistant

## 2023-05-01 ENCOUNTER — Ambulatory Visit: Payer: Medicare Other | Attending: Physician Assistant | Admitting: Physician Assistant

## 2023-05-01 VITALS — BP 114/76 | HR 67 | Ht 66.0 in | Wt 176.4 lb

## 2023-05-01 DIAGNOSIS — I25119 Atherosclerotic heart disease of native coronary artery with unspecified angina pectoris: Secondary | ICD-10-CM

## 2023-05-01 DIAGNOSIS — I10 Essential (primary) hypertension: Secondary | ICD-10-CM | POA: Diagnosis not present

## 2023-05-01 NOTE — Patient Instructions (Addendum)
 Medication Instructions:  NO CHANGES *If you need a refill on your cardiac medications before your next appointment, please call your pharmacy*   Lab Work: NO LABS If you have labs (blood work) drawn today and your tests are completely normal, you will receive your results only by: MyChart Message (if you have MyChart) OR A paper copy in the mail If you have any lab test that is abnormal or we need to change your treatment, we will call you to review the results.   Testing/Procedures: NO TESTING   Follow-Up: At [facility], you and your health needs are our priority.  As part of our continuing mission to provide you with exceptional heart care, we have created designated Provider Care Teams.  These Care Teams include your primary Cardiologist (physician) and Advanced Practice Providers (APPs -  Physician Assistants and Nurse Practitioners) who all work together to provide you with the care you need, when you need it.  We recommend signing up for the patient portal called "MyChart".  Sign up information is provided on this After Visit Summary.  MyChart is used to connect with patients for Virtual Visits (Telemedicine).  Patients are able to view lab/test results, encounter notes, upcoming appointments, etc.  Non-urgent messages can be sent to your provider as well.   To learn more about what you can do with MyChart, go to [email or web address].    Your next appointment:   KEEP FOLLOW UP Jul 18 2023  Provider:   Thomasene Ripple, DO

## 2023-05-01 NOTE — Progress Notes (Unsigned)
  Cardiology Office Note:  .   Date:  05/01/2023  ID:  [name], Lindsey 21-Oct-1955, MRN [id number] PCP: Everrett Coombe, DO  [facility] Providers Cardiologist:  Thomasene Ripple, DO { Click to update primary MD,subspecialty MD or APP then REFRESH:1}   History of Present Illness: [name]   [name] is a 68 y.o. female with PMH of CAD, SVT s/p ablation, tobacco abuse, HTN, HLD, AAA and history of right breast cancer status post radiation lobectomy.  Previous cardiac catheterization performed in August 2022 showed a 30% proximal RCA, 20% mid RCA, 20% OM2, and 100% mid LAD occlusion, EF 45 to 50%.  Medical therapy was recommended as LAD was not favorable target for PCI.  The vessel filled from collaterals.  Echocardiogram obtained on 11/23/2022 showed EF 60 to 65%, no regional wall motion abnormality, grade 1 DD, RVSP 36 mmHg, trivial MR, mild to moderate AI, mild dilatation of the ascending aorta measuring 41 mm.  PET stress test obtained on 11/30/2022 showed small defect of mild reduction in uptake in the apical apex location that is reversible, EF 62%.  Subsequent cardiac catheterization performed on 04/19/2023 showed 30% proximal RCA, 20% mid RCA, 20% OM2 and 100% mid LAD CTO.  There were left to left and the right to left collaterals.  LVEDP was normal.  Medical therapy was recommended.  Patient presents today for follow-up.  She continues to have intermittent chest discomfort.  We reviewed the recent cardiac authorization report.  She used to be on 15 mg daily of Imdur, she says Dr. Swaziland has instructed her to stop the Imdur.  She reportedly had significant drop in the blood pressure during the cardiac authorization down to the 60s.  Currently, the only antianginal medication is metoprolol tartrate 25 mg twice a day.  I will check with Dr. Marlowe Aschoff to see if she wished to add a low-dose amlodipine 2.5 mg daily for antianginal purposes.  Otherwise, patient can follow-up in 4 to 28-month.  ROS:  ***  Studies Reviewed: .        *** Risk Assessment/Calculations:   {Does this patient have ATRIAL FIBRILLATION?:[phone]}         Physical Exam:   VS:  BP 114/76   Pulse 67   Ht 5\' 6"  (1.676 m)   Wt 176 lb 6.4 oz (80 kg)   SpO2 94%   BMI 28.47 kg/m    Wt Readings from Last 3 Encounters:  05/01/23 176 lb 6.4 oz (80 kg)  04/19/23 178 lb 12.7 oz (81.1 kg)  04/16/23 178 lb 12.8 oz (81.1 kg)    GEN: Well nourished, well developed in no acute distress NECK: No JVD; No carotid bruits CARDIAC: ***RRR, no murmurs, rubs, gallops RESPIRATORY:  Clear to auscultation without rales, wheezing or rhonchi  ABDOMEN: Soft, non-tender, non-distended EXTREMITIES:  No edema; No deformity   ASSESSMENT AND PLAN: .   ***    {Are you ordering a CV Procedure (e.g. stress test, cath, DCCV, TEE, etc)?   Press F2        :[id number]}  Dispo: ***  Signed, Azalee Course, PA

## 2023-06-06 ENCOUNTER — Other Ambulatory Visit (HOSPITAL_COMMUNITY): Payer: Self-pay

## 2023-06-06 ENCOUNTER — Encounter: Payer: Self-pay | Admitting: Family Medicine

## 2023-06-06 ENCOUNTER — Telehealth: Payer: Self-pay

## 2023-06-06 ENCOUNTER — Ambulatory Visit (INDEPENDENT_AMBULATORY_CARE_PROVIDER_SITE_OTHER): Admitting: Family Medicine

## 2023-06-06 VITALS — BP 117/70 | HR 57 | Ht 66.0 in | Wt 179.0 lb

## 2023-06-06 DIAGNOSIS — I25118 Atherosclerotic heart disease of native coronary artery with other forms of angina pectoris: Secondary | ICD-10-CM

## 2023-06-06 MED ORDER — SEMAGLUTIDE-WEIGHT MANAGEMENT 0.5 MG/0.5ML ~~LOC~~ SOAJ: 0.5000 mg | SUBCUTANEOUS | 0 refills | Status: AC

## 2023-06-06 MED ORDER — SEMAGLUTIDE-WEIGHT MANAGEMENT 0.25 MG/0.5ML ~~LOC~~ SOAJ: 0.2500 mg | SUBCUTANEOUS | 0 refills | Status: AC

## 2023-06-06 MED ORDER — SEMAGLUTIDE-WEIGHT MANAGEMENT 1 MG/0.5ML ~~LOC~~ SOAJ
1.0000 mg | SUBCUTANEOUS | 1 refills | Status: DC
Start: 2023-08-03 — End: 2023-09-06

## 2023-06-06 NOTE — Telephone Encounter (Signed)
 Pharmacy Patient Advocate Encounter   Received notification from Onbase that prior authorization for [facility] 0.25MG /0.5ML auto-injectors is required/requested.   Insurance verification completed.   The patient is insured through [employer] .   Per test claim: PA required; PA submitted to above mentioned insurance via CoverMyMeds Key/confirmation #/EOC [id number] Status is pending
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
## 2023-06-06 NOTE — Assessment & Plan Note (Signed)
 She will continue current medications for medical management of her CAD and angina.  This is being managed by her cardiologist.  I do think she would benefit from addition of [facility] for further prevention of cardiovascular disease.  Counseled extensively on smoking cessation as well.

## 2023-06-08 ENCOUNTER — Other Ambulatory Visit (HOSPITAL_COMMUNITY): Payer: Self-pay

## 2023-06-11 NOTE — Telephone Encounter (Signed)
 Pharmacy Patient Advocate Encounter  Received notification from [facility] that Prior Authorization for [facility] 0.25MG /0.5ML auto-injectors has been DENIED.  Full denial letter will be uploaded to the media tab. See denial reason below.   PA #/Case ID/Reference #: [id number]

## 2023-06-14 NOTE — Telephone Encounter (Signed)
 Thanks linda. Does an appeal have to be initiated before we can call to set up the peer to peer ? Do you know ?

## 2023-06-14 NOTE — Telephone Encounter (Signed)
 Please call and assist Dr. Ashley Royalty in scheduling a peer to peer for this. He states he is available tomorrow at lunch time ( 06/15/23 around 12pm)  Thank you.

## 2023-06-15 NOTE — Telephone Encounter (Signed)
 Hello Bonita Quin,  The representative for [employer] did state that an appeal would need to be initiated before the peer to peer could be scheduled. Would you please initiate the appeal for [place]? Thank you for your help, Rogene Houston, LPN

## 2023-06-18 ENCOUNTER — Telehealth: Payer: Self-pay | Admitting: Pharmacist

## 2023-06-18 NOTE — Telephone Encounter (Signed)
 Appeal has been submitted for [facility]. Will advise when response is received, please be advised that most companies may take 30 days to make a decision. Appeal letter and supporting documentation have been faxed to [phone] on 06/18/2023 @3 :41 pm.  Thank you, Dellie Burns, PharmD Clinical Pharmacist  [place]  Direct Dial: [phone]

## 2023-06-20 NOTE — Telephone Encounter (Signed)
 Additional information has been requested from the patient's insurance in order to proceed with the prior authorization request. Requested information has been sent, or form has been filled out and faxed back to [phone]

## 2023-06-25 ENCOUNTER — Telehealth: Payer: Self-pay | Admitting: Family Medicine

## 2023-06-25 NOTE — Telephone Encounter (Signed)
 Copied from CRM [id number]. Topic: Clinical - Prescription Issue >> Jun 07, 2023 12:44 PM Carlatta H wrote: Reason for CRM: Please call about prior authorization [phone]//Reference [id number]

## 2023-06-27 ENCOUNTER — Other Ambulatory Visit (HOSPITAL_COMMUNITY): Payer: Self-pay

## 2023-06-27 ENCOUNTER — Telehealth: Payer: Self-pay

## 2023-06-27 NOTE — Telephone Encounter (Signed)
 Pharmacy Patient Advocate Encounter   Received notification from Patient Pharmacy that prior authorization for Wegovy 0.25 is required/requested.   Insurance verification completed.   The patient is insured through [facility] .   Per test claim: Medication is not eligible for pharmacy benefits. Patient has plan benefit exclusion for requested medication.

## 2023-06-29 ENCOUNTER — Other Ambulatory Visit (HOSPITAL_COMMUNITY): Payer: Self-pay

## 2023-07-04 ENCOUNTER — Other Ambulatory Visit (HOSPITAL_COMMUNITY): Payer: Self-pay

## 2023-07-04 NOTE — Telephone Encounter (Signed)
 Spoke with representative at [facility] - states that because patient has a Astronomer -the peer to peer is not an option aside form appeal that is already in process.  If questions regarding this gave # of clinician [phone]

## 2023-07-04 NOTE — Telephone Encounter (Signed)
 As of 06/27/23 appeal still pending. Would you like me to try and call again to see if I can schld a peer to peer or await results of the appeal?

## 2023-07-16 ENCOUNTER — Ambulatory Visit: Payer: Medicare Other | Admitting: Neurology

## 2023-07-16 ENCOUNTER — Other Ambulatory Visit (HOSPITAL_COMMUNITY): Payer: Self-pay

## 2023-07-16 NOTE — Telephone Encounter (Signed)
 The appeal for Wegovy   has been denied by the patient's insurance:

## 2023-07-18 ENCOUNTER — Encounter: Payer: Self-pay | Admitting: Cardiology

## 2023-07-18 ENCOUNTER — Ambulatory Visit: Payer: Medicare Other | Attending: Cardiology | Admitting: Cardiology

## 2023-07-18 VITALS — BP 108/68 | HR 55 | Ht 66.0 in | Wt 177.8 lb

## 2023-07-18 DIAGNOSIS — E782 Mixed hyperlipidemia: Secondary | ICD-10-CM

## 2023-07-18 DIAGNOSIS — I10 Essential (primary) hypertension: Secondary | ICD-10-CM

## 2023-07-18 DIAGNOSIS — I2510 Atherosclerotic heart disease of native coronary artery without angina pectoris: Secondary | ICD-10-CM

## 2023-07-18 DIAGNOSIS — Z720 Tobacco use: Secondary | ICD-10-CM

## 2023-07-18 DIAGNOSIS — Z79899 Other long term (current) drug therapy: Secondary | ICD-10-CM | POA: Diagnosis not present

## 2023-07-18 MED ORDER — METOPROLOL TARTRATE 25 MG PO TABS: 12.5000 mg | ORAL_TABLET | Freq: Two times a day (BID) | ORAL | 3 refills | Status: AC

## 2023-07-18 MED ORDER — RANOLAZINE ER 500 MG PO TB12: 500.0000 mg | ORAL_TABLET | Freq: Two times a day (BID) | ORAL | 3 refills | Status: DC

## 2023-07-18 NOTE — Patient Instructions (Signed)
 Medication Instructions:  Your physician has recommended you make the following change in your medication:  START: Ranexa  500 mg twice daily DECREASE: Lopressor  12.5 mg twice daily *If you need a refill on your cardiac medications before your next appointment, please call your pharmacy*  Lab Work: CMET, Mag, CBC If you have labs (blood work) drawn today and your tests are completely normal, you will receive your results only by: MyChart Message (if you have MyChart) OR A paper copy in the mail If you have any lab test that is abnormal or we need to change your treatment, we will call you to review the results.  Follow-Up: At [facility], you and your health needs are our priority.  As part of our continuing mission to provide you with exceptional heart care, our providers are all part of one team.  This team includes your primary Cardiologist (physician) and Advanced Practice Providers or APPs (Physician Assistants and Nurse Practitioners) who all work together to provide you with the care you need, when you need it.  Your next appointment:   6 month(s)  Provider:   Kardie Tobb, DO
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
## 2023-07-19 ENCOUNTER — Other Ambulatory Visit (HOSPITAL_COMMUNITY): Payer: Self-pay

## 2023-07-19 ENCOUNTER — Telehealth: Payer: Self-pay | Admitting: Cardiology

## 2023-07-19 LAB — COMPREHENSIVE METABOLIC PANEL WITH GFR
ALT: 18 IU/L (ref 0–32)
AST: 15 IU/L (ref 0–40)
Albumin: 4.5 g/dL (ref 3.9–4.9)
Alkaline Phosphatase: 93 IU/L (ref 44–121)
BUN/Creatinine Ratio: 22 (ref 12–28)
BUN: 19 mg/dL (ref 8–27)
Bilirubin Total: 0.4 mg/dL (ref 0.0–1.2)
CO2: 19 mmol/L — ABNORMAL LOW (ref 20–29)
Calcium: 10.4 mg/dL — ABNORMAL HIGH (ref 8.7–10.3)
Chloride: 109 mmol/L — ABNORMAL HIGH (ref 96–106)
Creatinine, Ser: 0.87 mg/dL (ref 0.57–1.00)
Globulin, Total: 2.4 g/dL (ref 1.5–4.5)
Glucose: 84 mg/dL (ref 70–99)
Potassium: 4.6 mmol/L (ref 3.5–5.2)
Sodium: 143 mmol/L (ref 134–144)
Total Protein: 6.9 g/dL (ref 6.0–8.5)
eGFR: 73 mL/min/{1.73_m2} (ref >59)

## 2023-07-19 LAB — CBC
Hematocrit: 45.5 % (ref 34.0–46.6)
Hemoglobin: 14.8 g/dL (ref 11.1–15.9)
MCH: 30.5 pg (ref 26.6–33.0)
MCHC: 32.5 g/dL (ref 31.5–35.7)
MCV: 94 fL (ref 79–97)
Platelets: 207 10*3/uL (ref 150–450)
RBC: 4.85 x10E6/uL (ref 3.77–5.28)
RDW: 13.1 % (ref 11.7–15.4)
WBC: 10.9 10*3/uL — ABNORMAL HIGH (ref 3.4–10.8)

## 2023-07-19 LAB — MAGNESIUM: Magnesium: 2.3 mg/dL (ref 1.6–2.3)

## 2023-07-19 NOTE — Telephone Encounter (Signed)
 Please advise on appropriate alternatives

## 2023-07-19 NOTE — Telephone Encounter (Signed)
 Pt c/o medication issue:  1. Name of Medication: ranolazine  (RANEXA ) 500 MG 12 hr tablet   2. How are you currently taking this medication (dosage and times per day)? As written   3. Are you having a reaction (difficulty breathing--STAT)? No   4. What is your medication issue? Pt called in stating this medication is $144 at [facility] and she cannot afford. She asked are there other cheaper options. Please advise.

## 2023-07-20 ENCOUNTER — Telehealth: Payer: Self-pay

## 2023-07-20 ENCOUNTER — Other Ambulatory Visit (HOSPITAL_COMMUNITY): Payer: Self-pay

## 2023-07-20 NOTE — Telephone Encounter (Signed)
 Technically its a generic. [facility], can we try a tier exception please?

## 2023-07-20 NOTE — Telephone Encounter (Signed)
 Faxed request to plan. See separate encounter

## 2023-07-20 NOTE — Telephone Encounter (Signed)
 Tier exception request has been faxed to [facility]. Status is pending.

## 2023-07-23 ENCOUNTER — Telehealth: Payer: Self-pay | Admitting: Cardiology

## 2023-07-23 NOTE — Telephone Encounter (Signed)
 Pharmacy Patient Advocate Encounter  Received notification from [employer] that Prior Authorization for RANOLAZINE  has been DENIED.  Full denial letter will be uploaded to the media tab. See denial reason below.  MUST USE ALL COVERED GENERICS IN TIER 2 AND 1 FIRST WHICH INCLUDE DILTIAZEM, AMLODIPINE, ATENOLOL, NIFEDIPINE, PROPANOLOL, AND VERAPAMIL 

## 2023-07-23 NOTE — Telephone Encounter (Signed)
 Received message from After Hours Answering Service  [name] with [employer] [facility] called to state that Prior Auth requests clinical questions.  REF # [id number]  No medication listed for which PA this is for

## 2023-07-24 ENCOUNTER — Ambulatory Visit: Payer: Self-pay | Admitting: Cardiology

## 2023-07-26 ENCOUNTER — Other Ambulatory Visit: Payer: Self-pay | Admitting: Family Medicine

## 2023-07-27 NOTE — Telephone Encounter (Unsigned)
 Copied from CRM [id number]. Topic: Clinical - Medication Refill >> Jul 27, 2023  8:35 AM Suzette B wrote: Medication:  topiramate  (TOPAMAX ) 100 MG tablet traZODone (DESYREL) 100 MG tablet   Has the patient contacted their pharmacy? Yes Yes stated they were out of refills to call the office   This is the patient's preferred pharmacy:  [facility] [address] [facility], [place] - [address] [facility] [place] [address] Phone: [phone] Fax: [phone]  Is this the correct pharmacy for this prescription? Yes If no, delete pharmacy and type the correct one.   Has the prescription been filled recently? Yes  Is the patient out of the medication? Yes  Has the patient been seen for an appointment in the last year OR does the patient have an upcoming appointment? Yes  Can we respond through MyChart? No  Agent: Please be advised that Rx refills may take up to 3 business days. We ask that you follow-up with your pharmacy.

## 2023-07-31 NOTE — Telephone Encounter (Signed)
 I have appealed the decision. Appeals letter faxed

## 2023-08-16 ENCOUNTER — Telehealth: Payer: Self-pay | Admitting: Pharmacy Technician

## 2023-08-16 ENCOUNTER — Other Ambulatory Visit (HOSPITAL_COMMUNITY): Payer: Self-pay

## 2023-08-16 NOTE — Telephone Encounter (Addendum)
 I called and spoke to [facility] and they said they did not receive the appeal fax. Spoke to [place] and she faxed again. Confirmed fax of [phone]. I started appeal case over the phone with representative: case# of [id number].

## 2023-08-20 ENCOUNTER — Other Ambulatory Visit (HOSPITAL_COMMUNITY): Payer: Self-pay

## 2023-08-20 NOTE — Telephone Encounter (Signed)
 I called insurance to check on appeal status and she said they received the information and they have appeal decision date of 08/23/23

## 2023-08-21 ENCOUNTER — Other Ambulatory Visit (HOSPITAL_COMMUNITY): Payer: Self-pay

## 2023-08-21 NOTE — Telephone Encounter (Signed)
 Prior authorization for tier exception approved until 03/12/2024. Ran test claim and it came up-0.00 for 90 days. I called [facility] pharmacy and asked them to run the ranexa  now for 90 days and they confirmed it is 0.00 for 90 days. I called the patient to let her know she can pick up her ranexa  in an hour.

## 2023-09-06 ENCOUNTER — Ambulatory Visit: Admitting: Family Medicine

## 2023-09-06 ENCOUNTER — Encounter: Payer: Self-pay | Admitting: Family Medicine

## 2023-09-06 VITALS — BP 107/71 | HR 68 | Ht 66.0 in | Wt 176.0 lb

## 2023-09-06 DIAGNOSIS — G43009 Migraine without aura, not intractable, without status migrainosus: Secondary | ICD-10-CM | POA: Diagnosis not present

## 2023-09-06 DIAGNOSIS — E782 Mixed hyperlipidemia: Secondary | ICD-10-CM | POA: Diagnosis not present

## 2023-09-06 DIAGNOSIS — I25118 Atherosclerotic heart disease of native coronary artery with other forms of angina pectoris: Secondary | ICD-10-CM | POA: Diagnosis not present

## 2023-09-06 DIAGNOSIS — I10 Essential (primary) hypertension: Secondary | ICD-10-CM

## 2023-09-06 MED ORDER — TOPIRAMATE 100 MG PO TABS: 200.0000 mg | ORAL_TABLET | Freq: Two times a day (BID) | ORAL | 1 refills | Status: AC
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
## 2023-09-06 NOTE — Assessment & Plan Note (Signed)
 Management per cardiology.  Ranexa  recently added however she is having some side effects.  She will plan to continue this for now at current strength.

## 2023-09-06 NOTE — Assessment & Plan Note (Signed)
 Managed with atorvastatin .  Will plan to continue current strength.

## 2023-09-06 NOTE — Assessment & Plan Note (Signed)
 Follow-up neurology.  Continues on topiramate  with Aimovig .  Migraines seem to be changing and she is having some ocular symptoms as well.  She will discuss this with her neurologist at upcoming visit.

## 2023-09-06 NOTE — Assessment & Plan Note (Signed)
Blood pressure is well-controlled at this time.  She will continue current medications.

## 2023-09-11 ENCOUNTER — Encounter (HOSPITAL_BASED_OUTPATIENT_CLINIC_OR_DEPARTMENT_OTHER): Payer: Self-pay

## 2023-09-11 ENCOUNTER — Emergency Department (HOSPITAL_BASED_OUTPATIENT_CLINIC_OR_DEPARTMENT_OTHER): Admission: EM | Admit: 2023-09-11 | Discharge: 2023-09-11 | Disposition: A | Discharge status: Home / Self Care

## 2023-09-11 ENCOUNTER — Emergency Department (HOSPITAL_BASED_OUTPATIENT_CLINIC_OR_DEPARTMENT_OTHER)

## 2023-09-11 ENCOUNTER — Other Ambulatory Visit: Payer: Self-pay

## 2023-09-11 ENCOUNTER — Telehealth: Payer: Self-pay | Admitting: Neurology

## 2023-09-11 DIAGNOSIS — Z91040 Latex allergy status: Secondary | ICD-10-CM | POA: Diagnosis not present

## 2023-09-11 DIAGNOSIS — H538 Other visual disturbances: Secondary | ICD-10-CM | POA: Insufficient documentation

## 2023-09-11 DIAGNOSIS — I672 Cerebral atherosclerosis: Secondary | ICD-10-CM | POA: Diagnosis not present

## 2023-09-11 DIAGNOSIS — R944 Abnormal results of kidney function studies: Secondary | ICD-10-CM | POA: Diagnosis not present

## 2023-09-11 DIAGNOSIS — I10 Essential (primary) hypertension: Secondary | ICD-10-CM | POA: Diagnosis not present

## 2023-09-11 DIAGNOSIS — Z7982 Long term (current) use of aspirin: Secondary | ICD-10-CM | POA: Diagnosis not present

## 2023-09-11 DIAGNOSIS — E042 Nontoxic multinodular goiter: Secondary | ICD-10-CM | POA: Diagnosis not present

## 2023-09-11 DIAGNOSIS — R519 Headache, unspecified: Secondary | ICD-10-CM | POA: Diagnosis not present

## 2023-09-11 DIAGNOSIS — Z79899 Other long term (current) drug therapy: Secondary | ICD-10-CM | POA: Insufficient documentation

## 2023-09-11 DIAGNOSIS — I6529 Occlusion and stenosis of unspecified carotid artery: Secondary | ICD-10-CM | POA: Diagnosis not present

## 2023-09-11 LAB — COMPREHENSIVE METABOLIC PANEL WITH GFR
ALT: 19 U/L (ref 0–44)
AST: 26 U/L (ref 15–41)
Albumin: 4.4 g/dL (ref 3.5–5.0)
Alkaline Phosphatase: 104 U/L (ref 38–126)
Anion gap: 10 (ref 5–15)
BUN: 18 mg/dL (ref 8–23)
CO2: 20 mmol/L — ABNORMAL LOW (ref 22–32)
Calcium: 10.6 mg/dL — ABNORMAL HIGH (ref 8.9–10.3)
Chloride: 110 mmol/L (ref 98–111)
Creatinine, Ser: 1.2 mg/dL — ABNORMAL HIGH (ref 0.44–1.00)
GFR, Estimated: 49 mL/min — ABNORMAL LOW (ref >60)
Glucose, Bld: 105 mg/dL — ABNORMAL HIGH (ref 70–99)
Potassium: 4.6 mmol/L (ref 3.5–5.1)
Sodium: 141 mmol/L (ref 135–145)
Total Bilirubin: 0.2 mg/dL (ref 0.0–1.2)
Total Protein: 7.5 g/dL (ref 6.5–8.1)

## 2023-09-11 LAB — DIFFERENTIAL
Abs Immature Granulocytes: 0.03 10*3/uL (ref 0.00–0.07)
Basophils Absolute: 0.1 10*3/uL (ref 0.0–0.1)
Basophils Relative: 1 %
Eosinophils Absolute: 0.4 10*3/uL (ref 0.0–0.5)
Eosinophils Relative: 5 %
Immature Granulocytes: 0 %
Lymphocytes Relative: 40 %
Lymphs Abs: 3.5 10*3/uL (ref 0.7–4.0)
Monocytes Absolute: 0.7 10*3/uL (ref 0.1–1.0)
Monocytes Relative: 8 %
Neutro Abs: 4 10*3/uL (ref 1.7–7.7)
Neutrophils Relative %: 46 %

## 2023-09-11 LAB — CBC
HCT: 45.5 % (ref 36.0–46.0)
Hemoglobin: 15.3 g/dL — ABNORMAL HIGH (ref 12.0–15.0)
MCH: 30.5 pg (ref 26.0–34.0)
MCHC: 33.6 g/dL (ref 30.0–36.0)
MCV: 90.8 fL (ref 80.0–100.0)
Platelets: 182 10*3/uL (ref 150–400)
RBC: 5.01 MIL/uL (ref 3.87–5.11)
RDW: 13 % (ref 11.5–15.5)
WBC: 8.7 10*3/uL (ref 4.0–10.5)
nRBC: 0 % (ref 0.0–0.2)

## 2023-09-11 LAB — ETHANOL: Alcohol, Ethyl (B): <15 mg/dL (ref <15)

## 2023-09-11 LAB — CBG MONITORING, ED: Glucose-Capillary: 100 mg/dL — ABNORMAL HIGH (ref 70–99)

## 2023-09-11 LAB — PROTIME-INR
INR: 0.9 (ref 0.8–1.2)
Prothrombin Time: 13 s (ref 11.4–15.2)

## 2023-09-11 LAB — APTT: aPTT: 27 s (ref 24–36)

## 2023-09-11 MED ORDER — KETOROLAC TROMETHAMINE 15 MG/ML IJ SOLN
15.0000 mg | Freq: Once | INTRAMUSCULAR | Status: AC
  Administered 2023-09-11: 15 mg via INTRAVENOUS
  Filled 2023-09-11: qty 1

## 2023-09-11 MED ORDER — IOHEXOL 350 MG/ML SOLN
75.0000 mL | Freq: Once | INTRAVENOUS | Status: AC | PRN
  Administered 2023-09-11: 100 mL via INTRAVENOUS

## 2023-09-11 MED ORDER — SODIUM CHLORIDE 0.9 % IV BOLUS
1000.0000 mL | Freq: Once | INTRAVENOUS | Status: AC
  Administered 2023-09-11: 1000 mL via INTRAVENOUS

## 2023-09-11 MED ORDER — SODIUM CHLORIDE 0.9% FLUSH
3.0000 mL | Freq: Once | INTRAVENOUS | Status: AC
  Administered 2023-09-11: 3 mL via INTRAVENOUS

## 2023-09-11 MED ORDER — PROCHLORPERAZINE EDISYLATE 10 MG/2ML IJ SOLN
10.0000 mg | Freq: Once | INTRAMUSCULAR | Status: AC
Start: 2023-09-11 — End: 2023-09-11
  Administered 2023-09-11: 10 mg via INTRAVENOUS
  Filled 2023-09-11: qty 2

## 2023-09-11 NOTE — Discharge Instructions (Signed)
 As we discussed, your workup today was normal.  This is great news.  This is likely related to your migraines.  I would like for you to follow-up with your neurologist for further evaluation.  You may return to the emergency department for any worsening symptoms.

## 2023-09-11 NOTE — Telephone Encounter (Signed)
 Tried calling patient no answer. LMOVM per Dr. Skeet BEAVER  How long ago did these episodes start?

## 2023-09-11 NOTE — ED Provider Notes (Signed)
 [place] EMERGENCY DEPARTMENT AT [facility] Provider Note   CSN: [id number] Arrival date & time: 09/11/23  1728     Patient presents with: Headache and Blurred Vision   [name] is a 68 y.o. female with chronic migraines who presents to the emergency department today for further evaluation of visual disturbances and headache.  This particular headache has been intermittent since Sunday.  She states it feels like an ice pick going into the right parietal scalp.  She says this is a new problem for her but has been present for the last month and a half.  She is currently followed by neurology.  She does state that she gets visual disturbances intermittently in the right eye secondary to her migraines.  She currently is complaining of some eye pain and blurred vision.  She denies any focal weakness or numbness.  She notified her neurologist who sent her to the emergency department today for imaging.    Headache      Prior to Admission medications   Medication Sig Start Date End Date Taking? Authorizing Provider  acetaminophen  (TYLENOL ) 500 MG tablet Take 1,000-1,500 mg by mouth at bedtime as needed for moderate pain.    [provider]  aspirin  EC 81 MG tablet Take 81 mg by mouth every other day. Swallow whole.    [provider]  atorvastatin  (LIPITOR) 40 MG tablet Take 1 tablet (40 mg total) by mouth daily. 01/31/23 09/06/23  Tobb, Kardie, DO  calcium  carbonate (TUMS - DOSED IN MG ELEMENTAL CALCIUM ) 500 MG chewable tablet Chew 1,000 mg by mouth daily as needed for indigestion or heartburn.    [provider]  Cholecalciferol (VITAMIN D) 50 MCG (2000 UT) tablet Take 2,000 Units by mouth daily.    [provider]  Erenumab -aooe (AIMOVIG ) 140 MG/ML SOAJ Inject 140 mg into the skin every 28 (twenty-eight) days. 03/02/23   Skeet, Adam R, DO  Liniments (BLUE-EMU SUPER STRENGTH EX) Apply 1 application topically daily as needed (pain).     [provider]  magnesium oxide (MAG-OX) 400 (240 Mg) MG tablet Take 400 mg by mouth daily.    [provider]  metoprolol  tartrate (LOPRESSOR ) 25 MG tablet Take 0.5 tablets (12.5 mg total) by mouth 2 (two) times daily. 07/18/23 10/16/23  Tobb, Kardie, DO  nitroGLYCERIN  (NITROSTAT ) 0.4 MG SL tablet Place 1 tablet (0.4 mg total) under the tongue every 5 (five) minutes as needed for chest pain. 01/31/23 09/06/23  Tobb, Kardie, DO  omeprazole (PRILOSEC) 40 MG capsule Take 40 mg by mouth daily. 09/29/21   [provider]  ranolazine  (RANEXA ) 500 MG 12 hr tablet Take 1 tablet (500 mg total) by mouth 2 (two) times daily. 07/18/23   Tobb, Kardie, DO  Riboflavin (B-2) 100 MG TABS Take 100 mg by mouth daily.    [provider]  topiramate  (TOPAMAX ) 100 MG tablet Take 2 tablets (200 mg total) by mouth 2 (two) times daily. 09/06/23   Alvia Bring, DO  traZODone (DESYREL) 100 MG tablet Take 100 mg by mouth at bedtime as needed for sleep. 12/31/22   [provider]  zinc gluconate 50 MG tablet Take 50 mg by mouth daily.    [provider]    Allergies: Latex, Codeine, and Tape    Review of Systems  Neurological:  Positive for headaches.  All other systems reviewed and are negative.   Updated Vital Signs BP 104/64   Pulse 61   Temp 98 F (  36.7 C)   Resp 18   Ht 5' 6 (1.676 m)   Wt 77.1 kg   SpO2 91%   BMI 27.44 kg/m   Physical Exam Vitals and nursing note reviewed.  Constitutional:      General: She is not in acute distress.    Appearance: Normal appearance.  HENT:     Head: Normocephalic and atraumatic.   Eyes:     General:        Right eye: No discharge.        Left eye: No discharge.     Comments: Ocular pressures were 15 bilaterally and normal.   Cardiovascular:     Comments: Regular rate and rhythm.  S1/S2 are distinct without any evidence of murmur, rubs, or gallops.  Radial pulses are 2+ bilaterally.  Dorsalis pedis pulses are 2+  bilaterally.  No evidence of pedal edema. Pulmonary:     Comments: Clear to auscultation bilaterally.  Normal effort.  No respiratory distress.  No evidence of wheezes, rales, or rhonchi heard throughout. Abdominal:     General: Abdomen is flat. Bowel sounds are normal. There is no distension.     Tenderness: There is no abdominal tenderness. There is no guarding or rebound.   Musculoskeletal:        General: Normal range of motion.     Cervical back: Neck supple.   Skin:    General: Skin is warm and dry.     Findings: No rash.   Neurological:     General: No focal deficit present.     Mental Status: She is alert.     Comments: Cranial nerves II to XII are intact.  5/5 strength to the upper and lower extremities.  Normal sensation to the upper lower extremities.  She does have some dysmetria with finger-nose.  It is slow but she is able to complete.  Speech is normal.  Psychiatric:        Mood and Affect: Mood normal.        Behavior: Behavior normal.     (all labs ordered are listed, but only abnormal results are displayed) Labs Reviewed  CBC - Abnormal; Notable for the following components:      Result Value   Hemoglobin 15.3 (*)    All other components within normal limits  COMPREHENSIVE METABOLIC PANEL WITH GFR - Abnormal; Notable for the following components:   CO2 20 (*)    Glucose, Bld 105 (*)    Creatinine, Ser 1.20 (*)    Calcium  10.6 (*)    GFR, Estimated 49 (*)    All other components within normal limits  CBG MONITORING, ED - Abnormal; Notable for the following components:   Glucose-Capillary 100 (*)    All other components within normal limits  PROTIME-INR  APTT  DIFFERENTIAL  ETHANOL    EKG: None  Radiology: CT Angio Head Neck W WO CM Result Date: 09/11/2023 CLINICAL DATA:  Vision loss, monocular. Headache and blurred vision for 5 weeks with recent worsening. History of migraines. EXAM: CT ANGIOGRAPHY HEAD AND NECK WITH AND WITHOUT CONTRAST TECHNIQUE:  Multidetector CT imaging of the head and neck was performed using the standard protocol during bolus administration of intravenous contrast. Multiplanar CT image reconstructions and MIPs were obtained to evaluate the vascular anatomy. Carotid stenosis measurements (when applicable) are obtained utilizing NASCET criteria, using the distal internal carotid diameter as the denominator. RADIATION DOSE REDUCTION: This exam was performed according to the departmental dose-optimization program which includes automated  exposure control, adjustment of the mA and/or kV according to patient size and/or use of iterative reconstruction technique. CONTRAST:  OMNIPAQUE  IOHEXOL  350 MG/ML SOLN COMPARISON:  CTA head 03/15/2023. FINDINGS: CT HEAD FINDINGS Brain: No acute intracranial hemorrhage. No CT evidence of acute infarct. No edema, mass effect, or midline shift. The basilar cisterns are patent. Ventricles: Ventricles are normal in size and configuration. Vascular: No hyperdense vessel. Intracranial atherosclerotic calcifications noted. Skull: No acute or aggressive finding. Sinuses/orbits: Orbits are symmetric. Mucosal thickening in the right ethmoid sinus. Other: Mastoid air cells are clear. CTA NECK FINDINGS Aortic arch: Limited visualization of the aortic arch. Visualized portions of the distal arch demonstrating mild atherosclerosis. Pulmonary arteries: As permitted by contrast timing, there are no filling defects in the visualized pulmonary arteries. Subclavian arteries: Patent bilaterally. Atherosclerosis of the proximal right subclavian artery. Right carotid system: Patent. Mild atherosclerosis at the carotid bifurcation without hemodynamically significant stenosis. No evidence of dissection. Mild tortuosity of the mid distal cervical ICA. Left carotid system: Patent. Mild atherosclerosis at the carotid bifurcation without hemodynamically significant stenosis. No evidence of dissection. Vertebral arteries: Left  vertebral artery is dominant. No evidence of dissection, stenosis (50% or greater), or occlusion. Mild atherosclerosis of the left V4 segment without significant stenosis. Skeleton: Degenerative disc space narrowing greatest at C4-5. Other neck: The visualized airway is patent. No cervical lymphadenopathy. Heterogeneous appearance of the thyroid  with multiple nodules measuring up to 1.6 cm. Upper chest: Visualized lung apices are clear. Review of the MIP images confirms the above findings CTA HEAD FINDINGS ANTERIOR CIRCULATION: The intracranial ICAs are patent bilaterally. Mild atherosclerosis of the carotid siphons. No significant stenosis, proximal occlusion, aneurysm, or vascular malformation. MCAs: The middle cerebral arteries are patent bilaterally. ACAs: The anterior cerebral arteries are patent bilaterally. POSTERIOR CIRCULATION: No significant stenosis, proximal occlusion, aneurysm, or vascular malformation. PCAs: The posterior cerebral arteries are patent bilaterally. Pcomm: Visualized on the right. SCAs: The superior cerebellar arteries are patent bilaterally. Basilar artery: Patent AICAs: Visualized on the left. PICAs: Visualized on the right. Vertebral arteries: The intracranial vertebral arteries are patent. Venous sinuses: As permitted by contrast timing, patent. Anatomic variants: None Review of the MIP images confirms the above findings IMPRESSION: No large vessel occlusion. No high-grade No CT evidence of acute intracranial abnormality. Heterogeneous appearance of the thyroid  with multiple nodules measuring up to 1.6 cm. Recommend nonemergent correlation with thyroid  ultrasound. Aortic Atherosclerosis (ICD10-I70.0). Electronically Signed   By: Donnice Mania M.D.   On: 09/11/2023 20:03     Procedures   Medications Ordered in the ED  sodium chloride  flush (NS) 0.9 % injection 3 mL (3 mLs Intravenous Given 09/11/23 1836)  sodium chloride  0.9 % bolus 1,000 mL (0 mLs Intravenous Stopped 09/11/23  1946)  ketorolac (TORADOL) 15 MG/ML injection 15 mg (15 mg Intravenous Given 09/11/23 1835)  prochlorperazine (COMPAZINE) injection 10 mg (10 mg Intravenous Given 09/11/23 1835)  iohexol  (OMNIPAQUE ) 350 MG/ML injection 75 mL (100 mLs Intravenous Contrast Given 09/11/23 1911)    Clinical Course as of 09/11/23 2014  Tue Sep 11, 2023  2009 On repeat evaluation, patient states that her headache and blurred vision has improved with pain medication.  I went over all labs and imaging with her at the bedside.  All questions or concerns addressed. [CF]  2009 Comprehensive metabolic panel(!) Elevated creatinine in comparison to baseline.  Given fluid bolus here.  Patient able to tolerate p.o. fluids at home. [CF]  2010 CBC(!) Negative. [CF]  2010 Protime-INR Negative. [  CF]  2010 APTT Negative. [CF]  2010 CT Angio Head Neck W WO CM I personally ordered interpreted the study.  This is negative.  I do agree with the radiologist interpretation. [CF]    Clinical Course User Index [CF] Theotis Cameron HERO, PA-C    Medical Decision Making TAWYNA PELLOT is a 69 y.o. female patient who presents to the emergency department today for further evaluation of blurred vision and headache.  I am less suspicious for stroke at this time.  Neurological exam is normal apart from some slow finger-to-nose but patient was able to perform this.  No obvious visual field deficits.  We will get a CTA head and neck to further evaluate for any vascular changes though.  Will give her migraine cocktail, fluids and get some labs as well.  As highlighted in ED course, workup is ultimately negative.  She is feeling better after migraine cocktail and right-sided eye pain and blurred vision is improved in addition to right-sided headache.  I will have her follow-up with her neurologist for further evaluation.  Strict return precautions were discussed.  She is safe for discharge.   Amount and/or Complexity of Data Reviewed Labs: ordered.  Decision-making details documented in ED Course. Radiology: ordered. Decision-making details documented in ED Course.  Risk Prescription drug management.    Final diagnoses:  Acute nonintractable headache, unspecified headache type  Blurred vision    ED Discharge Orders     None          Theotis Cameron HERO, PA-C 09/11/23 2014    Neysa Caron PARAS, DO 09/11/23 2133

## 2023-09-11 NOTE — Telephone Encounter (Signed)
 Pt came in stating she has had something going on with her right eye. Split vision and blurred vision. It has happened about 6 times and lasts about a minute then clears up by itself. Her nurse neighbor told her she thinks it could be a mini stroke. She saw her PCP, but he didn't see anything going on with her eye and wanted her to check with our office. She stated she did not want to go to the ED. She will be home by 11:30 or 12. She does not have a cell phone.

## 2023-09-11 NOTE — Telephone Encounter (Signed)
 Started the end of May bu this last time was worse lasting longer then other 6 times.  More painful, and she felt like an explode in her head  More related to more stress she is under now.  Her kids are going through some medical problems right now.

## 2023-09-11 NOTE — Telephone Encounter (Signed)
 Change in migraine is always possible but my first concern is if she has plaque build up in the right carotid artery. She needs imaging right away, so she needs to go to the ED. If that is okay, then we can handle next steps from there.  Patient advised.

## 2023-09-11 NOTE — ED Triage Notes (Signed)
 Pt POV ambulatory to room reporting headaches and blurred vision x5 weeks, worsened Sunday, relates it to increased stress, hx migraines, neurologist advised she be seen in ED.

## 2023-09-12 ENCOUNTER — Other Ambulatory Visit: Payer: Self-pay

## 2023-09-12 MED ORDER — TRAZODONE HCL 100 MG PO TABS: 100.0000 mg | ORAL_TABLET | Freq: Every evening | ORAL | 0 refills | Status: AC | PRN

## 2023-09-13 ENCOUNTER — Telehealth: Payer: Self-pay | Admitting: Cardiology

## 2023-09-13 NOTE — Telephone Encounter (Signed)
 Patient says she has been having headaches followed by blurred vision for about 5-6 weeks. She has had about 7-8 episodes. Lasts about 1 minute. No symptoms currently. See ED notes.

## 2023-09-13 NOTE — Telephone Encounter (Signed)
 Called patient back about message. Patient complaining of migraines, blurred vision, nausea, etc that come and go. Patient has been having these symptoms before starting Ranexa  and does not think it is side effects of the medication. Patient stated her neurologist sent her to the ED to get Ct of her head and an EEG. Patient stated she never got an EEG in the ED. Patient is calling to let Dr. Sheena know what is going on and she wants to see what Dr. Sheena thinks. Patient was wondering if she needs to have her carotids checked out. Patient stated she is fine right now, but this weeks episode was the worst she has had. Patient knows Dr. Sheena is out, but would like to hear back from her when she is back. Encouraged patient to go to the ED if her symptoms get worse.

## 2023-09-18 ENCOUNTER — Ambulatory Visit (INDEPENDENT_AMBULATORY_CARE_PROVIDER_SITE_OTHER): Admitting: Family Medicine

## 2023-09-18 ENCOUNTER — Encounter: Payer: Self-pay | Admitting: Family Medicine

## 2023-09-18 VITALS — BP 135/75 | HR 63 | Ht 66.0 in | Wt 180.0 lb

## 2023-09-18 DIAGNOSIS — E041 Nontoxic single thyroid nodule: Secondary | ICD-10-CM | POA: Diagnosis not present

## 2023-09-18 DIAGNOSIS — H5711 Ocular pain, right eye: Secondary | ICD-10-CM | POA: Insufficient documentation

## 2023-09-18 NOTE — Assessment & Plan Note (Signed)
 Possibly related to migraine/cluster headaches.  Has f/u with neurology.  Will place referral to ophthalmology as well given continued vision disturbances as well.
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
## 2023-09-18 NOTE — Assessment & Plan Note (Signed)
 US thyroid ordered.

## 2023-09-19 ENCOUNTER — Ambulatory Visit

## 2023-09-19 ENCOUNTER — Ambulatory Visit: Payer: Self-pay | Admitting: Family Medicine

## 2023-09-19 DIAGNOSIS — E041 Nontoxic single thyroid nodule: Secondary | ICD-10-CM | POA: Diagnosis not present

## 2023-09-19 DIAGNOSIS — E042 Nontoxic multinodular goiter: Secondary | ICD-10-CM | POA: Diagnosis not present
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
## 2023-09-26 ENCOUNTER — Encounter: Payer: Self-pay | Admitting: Neurology

## 2023-09-26 ENCOUNTER — Other Ambulatory Visit

## 2023-09-26 ENCOUNTER — Ambulatory Visit: Payer: Medicare Other

## 2023-09-26 ENCOUNTER — Ambulatory Visit: Payer: Medicare Other | Admitting: Neurology

## 2023-09-26 VITALS — BP 136/60 | HR 96 | Ht 65.0 in | Wt 180.0 lb

## 2023-09-26 DIAGNOSIS — G4485 Primary stabbing headache: Secondary | ICD-10-CM

## 2023-09-26 DIAGNOSIS — G43009 Migraine without aura, not intractable, without status migrainosus: Secondary | ICD-10-CM

## 2023-09-26 DIAGNOSIS — H5461 Unqualified visual loss, right eye, normal vision left eye: Secondary | ICD-10-CM

## 2023-09-26 NOTE — Patient Instructions (Addendum)
 Would still change to Vyepti  otherwise continue Aimovig  Take melatonin 10mg  at bedtime Check sed rate and CRP Continue topiramate  for now. Follow up in 8 months.

## 2023-09-27 ENCOUNTER — Ambulatory Visit: Payer: Self-pay | Admitting: Neurology

## 2023-09-27 ENCOUNTER — Telehealth: Payer: Self-pay | Admitting: Neurology

## 2023-09-27 LAB — SEDIMENTATION RATE: Sed Rate: 2 mm/h (ref 0–30)

## 2023-09-27 LAB — C-REACTIVE PROTEIN: CRP: <3 mg/L (ref <8.0)

## 2023-09-27 NOTE — Progress Notes (Signed)
 Patient advised.

## 2023-09-27 NOTE — Telephone Encounter (Signed)
 Patient advised. Per Patient whatever Rexene thinks is best is what she will do.

## 2023-09-27 NOTE — Telephone Encounter (Signed)
 As per prior auth team: Dr. Skeet and Jesusa, [facility] will not approve Vyepti because the patient must try for 3 consecutive months and fail 2 of the 3 preferred meds first. Preferred meds are Aimovig , Emgality and Ajovy, all of which do not need a prior authorization. If you would still like to continue with Vypeti, a peer to peer needs to be scheduled within the next 24 hours by calling [phone]. Please let me know the plan and I will take care of the referral based on the plan.  Thank you, Krista    If patient is agreeable, please send in prescription for AJOVY EVERY 28 DAYS, REFILL 11.  If no improvement in 3 months, contact us  and we can start the other injection, Emgality

## 2023-09-28 ENCOUNTER — Telehealth: Payer: Self-pay

## 2023-09-28 ENCOUNTER — Other Ambulatory Visit (HOSPITAL_COMMUNITY): Payer: Self-pay

## 2023-09-28 MED ORDER — AJOVY 225 MG/1.5ML ~~LOC~~ SOAJ: 225.0000 mg | SUBCUTANEOUS | 5 refills | Status: DC

## 2023-09-28 NOTE — Telephone Encounter (Signed)
 Please see DR.Jaffe last note patient is still having migraines 4-5 times a week.

## 2023-09-28 NOTE — Addendum Note (Signed)
 Addended by: OZELL JESUSA PARAS on: 09/28/2023 08:57 AM   Modules accepted: Orders

## 2023-09-28 NOTE — Telephone Encounter (Signed)
*  [facility]  Pharmacy Patient Advocate Encounter   Received notification from CoverMyMeds that prior authorization for AJOVY (fremanezumab-vfrm) injection 225MG /1.5ML auto-injectors  is required/requested.   Insurance verification completed.   The patient is insured through [facility] .   Per test claim:  Aimovig , Emgality, Mickel is preferred by the insurance.  If suggested medication is appropriate, Please send in a new RX and discontinue this one. If not, please advise as to why it's not appropriate so that we may request a Prior Authorization. Please note, some preferred medications may still require a PA.  If the suggested medications have not been trialed and there are no contraindications to their use, the PA will not be submitted, as it will not be approved.   CMM Key: [id number]  Aimovig - $302.00 Emgality and Qulipta needs PA.

## 2023-09-28 NOTE — Telephone Encounter (Signed)
 Per Dr.Jaffe Ajovy 255 mg with 11 refills sent to the pharmacy.    PA team please start a PA for Ajovy

## 2023-09-28 NOTE — Telephone Encounter (Signed)
 PA request has been Received. New Encounter has been or will be created for follow up. For additional info see Pharmacy Prior Auth telephone encounter from 07/18.

## 2023-09-28 NOTE — Telephone Encounter (Signed)
 Pt called to let jaffe know the Ajovy has been denied

## 2023-10-01 ENCOUNTER — Other Ambulatory Visit (HOSPITAL_COMMUNITY): Payer: Self-pay

## 2023-10-01 ENCOUNTER — Telehealth: Payer: Self-pay

## 2023-10-01 NOTE — Telephone Encounter (Signed)
 Pharmacy Patient Advocate Encounter   Per test claim: PA required; PA submitted to above mentioned insurance via CoverMyMeds Key/confirmation #/EOC [id number] Status is pending

## 2023-10-01 NOTE — Telephone Encounter (Signed)
 Auth Submission: DENIED Site of care: Site of care: [facility] Payer: [employer] medicare Medication & CPT/J Code(s) submitted: Vyepti (Eptinezumab) [id number] Diagnosis Code:  Route of submission (phone, fax, portal): portal  Authorization has been DENIED because [employer] will not approve Vyepti because the patient must try for 3 consecutive months and fail 2 of the 3 preferred meds first. Preferred meds are Aimovig , Emgality and Ajovy , all of which do not need a prior authorization. If you would still like to continue with Vypeti, a peer to peer needs to be scheduled within the next 24 hours by calling [phone]. Please let me know the plan and I will take care of the referral based on the plan.

## 2023-10-02 NOTE — Telephone Encounter (Signed)
 Pharmacy Patient Advocate Encounter  Received notification from [employer] that Prior Authorization for AJOVY  (fremanezumab -vfrm) injection 225MG /1.5ML auto-injectors has been DENIED.  Full denial letter will be uploaded to the media tab. See denial reason below.  Medication authorization requires the following:  (1) You need to try two (2) of these covered drugs:  (A) Emgality autoinjector* (B) Qulipta * (2) OR your doctor needs to give us  specific medical reasons why two (2) of the covered drug(s) are not appropriate for you  *Please note: Covered drug(s) may require prior authorization  PA #/Case ID/Reference #: [id number]

## 2023-10-03 ENCOUNTER — Telehealth: Payer: Self-pay

## 2023-10-03 ENCOUNTER — Other Ambulatory Visit (HOSPITAL_COMMUNITY): Payer: Self-pay

## 2023-10-03 MED ORDER — QULIPTA 60 MG PO TABS: 60.0000 mg | ORAL_TABLET | Freq: Every day | ORAL | 5 refills | Status: DC

## 2023-10-03 NOTE — Telephone Encounter (Signed)
 Pharmacy Patient Advocate Encounter   Received notification from Pt Calls Messages that prior authorization for Qulipta  60MG  tablets is required/requested.   Insurance verification completed.   The patient is insured through [employer] Medicare Part D.   Per test claim: PA required; PA submitted to above mentioned insurance via CoverMyMeds Key/confirmation #/EOC [id number] Status is pending

## 2023-10-03 NOTE — Telephone Encounter (Signed)
LMOVM with approval.

## 2023-10-03 NOTE — Telephone Encounter (Signed)
 Patient advised of Dr.Jaffe, We will need to try other medications first. If she is agreeable, please send in a prescription for a daily pill called QULIPTA  60MG  DAILY QTY 30, REFILL 5. If no improvement in 3 months, contact us  and we can change to another preventative.

## 2023-10-03 NOTE — Telephone Encounter (Signed)
 Yes, it does require a PA. I will start that now

## 2023-10-03 NOTE — Telephone Encounter (Signed)
 Pharmacy Patient Advocate Encounter  Received notification from [employer] Medicare Part D that Prior Authorization for Qulipta  60MG  tablets has been APPROVED from 10-03-2023 to 03-12-2024   PA #/Case ID/Reference #: [id number]

## 2023-10-05 ENCOUNTER — Telehealth: Payer: Self-pay | Admitting: Neurology

## 2023-10-05 NOTE — Telephone Encounter (Signed)
 Pt called to let sheena know her ins refused botox, injectable meds, they did finally approve the pill- she doesn't remember the name. But the deductible is 537 a month and she can't pay that. She would like a call back

## 2023-10-05 NOTE — Telephone Encounter (Signed)
 Qulipta  information [employer].

## 2023-10-09 ENCOUNTER — Other Ambulatory Visit: Payer: Self-pay

## 2023-10-09 NOTE — Telephone Encounter (Signed)
 Patient identification verified by 2 forms.   Called and spoke to patient  Pt states she went to once daily than she stopped taking it on the 16th.  July 1st pt was in the hospital.  Pt seen her GP and her neurologist, she has had an ultrasound of her thyroid  - not as serious as they thought it was.

## 2023-10-09 NOTE — Progress Notes (Signed)
 Pt stopped taking on the 16th

## 2023-10-10 ENCOUNTER — Telehealth: Payer: Self-pay | Admitting: Family Medicine

## 2023-10-10 NOTE — Telephone Encounter (Signed)
 Pt called and LM again, stating she called this morning for sheena and she has not called her back yet. She would like a call back today.

## 2023-10-10 NOTE — Telephone Encounter (Signed)
 Pt. Says Pharmacy was not able to approve pricing for new Rx that has been Prior authorized, Pt would like to call for next steps

## 2023-10-10 NOTE — Telephone Encounter (Signed)
 The patient called stating that she was supposed to be referred to an optometrist associated with [facility]. I see that a referral was faxed to Oneil Dixons, OD, in [place]. I informed the patient of this, but she said she does not want to see that provider. She mentioned that Dr. Alvia was aware of this and had referred her to a different provider, but she does not know the name.  Do you happen to know which [facility] optometrist was recommended? If so, could we resend the referral to them?  The patient will be in the office on Friday. Since she does not have access to MyChart, she would like a copy of her ultrasound results and the name of the referred optometrist. I have the results ready for pick up.  Thank you for your help.

## 2023-10-11 NOTE — Telephone Encounter (Signed)
 Patient will call [employer] to see if they can go over the Assistance with her over the phone since she do not have a computer to fill out the form.   Advised patient if she hasn't started the Qulipta  yet please go ahead and call to have Aimovig  delivered until she can start.

## 2023-10-11 NOTE — Telephone Encounter (Signed)
 No, I sent it to [facility].

## 2023-10-15 NOTE — Telephone Encounter (Signed)
 Yes, no new order is needed.

## 2023-10-18 ENCOUNTER — Telehealth: Payer: Self-pay | Admitting: Neurology

## 2023-10-18 NOTE — Telephone Encounter (Signed)
 Pt calling agian about reduce pricing Qulipta , Pt will order Aimivig while awaiting financial assistance, would like a call bk

## 2023-10-19 NOTE — Telephone Encounter (Signed)
 Per patient she will fill out the patient portion of the Assistance for Qulipta .  Patient will go ahead and order the aimovig  until she can get the Assistance with Qulipta .   Will send the physician portion as well. Tried going on line to see if there was a provider portion did not see one. Will wait for the company to reach out to us .

## 2023-10-24 DIAGNOSIS — R519 Headache, unspecified: Secondary | ICD-10-CM | POA: Diagnosis not present

## 2023-10-24 DIAGNOSIS — H547 Unspecified visual loss: Secondary | ICD-10-CM | POA: Diagnosis not present

## 2023-10-24 DIAGNOSIS — H5711 Ocular pain, right eye: Secondary | ICD-10-CM | POA: Diagnosis not present

## 2023-10-24 DIAGNOSIS — I739 Peripheral vascular disease, unspecified: Secondary | ICD-10-CM | POA: Diagnosis not present

## 2023-11-13 ENCOUNTER — Telehealth: Payer: Self-pay | Admitting: Neurology

## 2023-11-13 NOTE — Telephone Encounter (Signed)
 Pt called in today and she new a new prescription for Aimovig  140 mg injection onces a month. Pt's number is [phone].  Thanks

## 2023-11-13 NOTE — Telephone Encounter (Signed)
 Ok to refill Aimovig  for now, thanks

## 2023-11-13 NOTE — Telephone Encounter (Signed)
 Per last note Plan to discontinue Aimovig  and to start Vyepti.  Still on Aimovig  because she checked with her insurance and was told it wouldn't be covered.

## 2023-11-16 NOTE — Telephone Encounter (Signed)
 Called patient and left a message for a call back.

## 2023-11-21 ENCOUNTER — Other Ambulatory Visit: Payer: Self-pay | Admitting: Neurology

## 2023-11-21 MED ORDER — AIMOVIG 140 MG/ML ~~LOC~~ SOAJ: 140.0000 mg | SUBCUTANEOUS | 11 refills | Status: DC

## 2023-11-21 NOTE — Telephone Encounter (Signed)
 Called and spoke to patient and informd her Dr. Jayme advice per below  We were changing the Aimovig  because it wasn't working. She was approved for Qulipta . Dis she start this? If so, she needs to give it 3 months before determining if we need to make a change.   Patient informed me that she did not pick up nor start the Qlipta because it was going to cost her $523 a month. Patient had gotten her Aimovig  filled and has started that. Patient started she has a a lot going on on right now as her daughter is in the hospital with breast cancer and is septic.   Patient had no further questions or concerns.

## 2023-11-30 ENCOUNTER — Ambulatory Visit (INDEPENDENT_AMBULATORY_CARE_PROVIDER_SITE_OTHER): Admitting: Family Medicine

## 2023-11-30 ENCOUNTER — Encounter: Payer: Self-pay | Admitting: Family Medicine

## 2023-11-30 VITALS — BP 120/75 | HR 69 | Ht 65.0 in | Wt 173.0 lb

## 2023-11-30 DIAGNOSIS — R1032 Left lower quadrant pain: Secondary | ICD-10-CM

## 2023-11-30 DIAGNOSIS — K625 Hemorrhage of anus and rectum: Secondary | ICD-10-CM | POA: Diagnosis not present

## 2023-11-30 MED ORDER — AMOXICILLIN-POT CLAVULANATE 875-125 MG PO TABS: 1.0000 | ORAL_TABLET | Freq: Two times a day (BID) | ORAL | 0 refills | Status: DC

## 2023-11-30 MED ORDER — HYDROCORTISONE ACETATE 25 MG RE SUPP: 25.0000 mg | Freq: Two times a day (BID) | RECTAL | 3 refills | Status: DC | PRN

## 2023-11-30 NOTE — Patient Instructions (Signed)
 Start amoxicillin .  Stop downstairs to schedule CT scan.
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
## 2023-12-01 LAB — CMP14+EGFR
ALT: 19 IU/L (ref 0–32)
AST: 23 IU/L (ref 0–40)
Albumin: 4.6 g/dL (ref 3.9–4.9)
Alkaline Phosphatase: 96 IU/L (ref 49–135)
BUN/Creatinine Ratio: 21 (ref 12–28)
BUN: 22 mg/dL (ref 8–27)
Bilirubin Total: 0.3 mg/dL (ref 0.0–1.2)
CO2: 20 mmol/L (ref 20–29)
Calcium: 10.4 mg/dL — ABNORMAL HIGH (ref 8.7–10.3)
Chloride: 110 mmol/L — ABNORMAL HIGH (ref 96–106)
Creatinine, Ser: 1.04 mg/dL — ABNORMAL HIGH (ref 0.57–1.00)
Globulin, Total: 2.4 g/dL (ref 1.5–4.5)
Glucose: 85 mg/dL (ref 70–99)
Potassium: 4.5 mmol/L (ref 3.5–5.2)
Sodium: 146 mmol/L — ABNORMAL HIGH (ref 134–144)
Total Protein: 7 g/dL (ref 6.0–8.5)
eGFR: 59 mL/min/1.73 — ABNORMAL LOW (ref >59)

## 2023-12-01 LAB — CBC WITH DIFFERENTIAL/PLATELET
Basophils Absolute: 0 x10E3/uL (ref 0.0–0.2)
Basos: 0 %
EOS (ABSOLUTE): 0.5 x10E3/uL — ABNORMAL HIGH (ref 0.0–0.4)
Eos: 5 %
Hematocrit: 45.3 % (ref 34.0–46.6)
Hemoglobin: 14.9 g/dL (ref 11.1–15.9)
Immature Grans (Abs): 0 x10E3/uL (ref 0.0–0.1)
Immature Granulocytes: 0 %
Lymphocytes Absolute: 2.6 x10E3/uL (ref 0.7–3.1)
Lymphs: 28 %
MCH: 31 pg (ref 26.6–33.0)
MCHC: 32.9 g/dL (ref 31.5–35.7)
MCV: 94 fL (ref 79–97)
Monocytes Absolute: 0.6 x10E3/uL (ref 0.1–0.9)
Monocytes: 6 %
Neutrophils Absolute: 5.8 x10E3/uL (ref 1.4–7.0)
Neutrophils: 61 %
Platelets: 195 x10E3/uL (ref 150–450)
RBC: 4.8 x10E6/uL (ref 3.77–5.28)
RDW: 12.9 % (ref 11.7–15.4)
WBC: 9.6 x10E3/uL (ref 3.4–10.8)

## 2023-12-02 DIAGNOSIS — R1032 Left lower quadrant pain: Secondary | ICD-10-CM | POA: Insufficient documentation

## 2023-12-02 NOTE — Assessment & Plan Note (Signed)
 She does have some left lower quadrant pain as well as rectal bleeding.  Concern for possible colitis versus diverticulitis.  CT of abdomen and pelvis ordered.  Checking labs including CMP and CBC.  Will go ahead and add course of Augmentin .

## 2023-12-03 ENCOUNTER — Ambulatory Visit (INDEPENDENT_AMBULATORY_CARE_PROVIDER_SITE_OTHER)

## 2023-12-03 DIAGNOSIS — N200 Calculus of kidney: Secondary | ICD-10-CM | POA: Diagnosis not present

## 2023-12-03 DIAGNOSIS — K8689 Other specified diseases of pancreas: Secondary | ICD-10-CM | POA: Diagnosis not present

## 2023-12-03 DIAGNOSIS — I7143 Infrarenal abdominal aortic aneurysm, without rupture: Secondary | ICD-10-CM | POA: Diagnosis not present

## 2023-12-03 DIAGNOSIS — R1032 Left lower quadrant pain: Secondary | ICD-10-CM | POA: Diagnosis not present

## 2023-12-03 DIAGNOSIS — K921 Melena: Secondary | ICD-10-CM | POA: Diagnosis not present

## 2023-12-03 DIAGNOSIS — K625 Hemorrhage of anus and rectum: Secondary | ICD-10-CM

## 2023-12-03 MED ORDER — IOHEXOL 9 MG/ML PO SOLN
500.0000 mL | ORAL | Status: AC
  Administered 2023-12-03 (×2): 500 mL via ORAL

## 2023-12-03 MED ORDER — IOHEXOL 300 MG/ML  SOLN
100.0000 mL | Freq: Once | INTRAMUSCULAR | Status: AC | PRN
  Administered 2023-12-03: 100 mL via INTRAVENOUS

## 2023-12-06 ENCOUNTER — Telehealth: Payer: Self-pay

## 2023-12-06 NOTE — Telephone Encounter (Signed)
 Copied from CRM #[id number]. Topic: Clinical - Lab/Test Results >> Dec 06, 2023  8:56 AM Treva T wrote: Reason for CRM: Received call from patient, would like a return call from office with lab and imaging results. Patient stets she does not have access to MyChart, and would like a return call to discuss results.  Patient can be reached at [phone] if within the next hour-2 hour from time of call, if after that time please reach out to ph. [phone] to discuss status of results/final results further.   Patient aware of same day call back.

## 2023-12-06 NOTE — Telephone Encounter (Signed)
 Lab work from 11/30/2023 not yet reviewed by provider.  Forwarding to Dr. Alvia for review and then will contact patient.

## 2023-12-06 NOTE — Telephone Encounter (Signed)
 Spoke with patient informed of results of lab work and that CT was still pending.  She is requesting that once the CT results are received  if we could first try to call her at [phone] which is her daughter's number.  Lab work results placed in the mail to patient as she can not get into Mychart to view them herself.

## 2023-12-10 ENCOUNTER — Telehealth: Payer: Self-pay

## 2023-12-10 ENCOUNTER — Ambulatory Visit: Payer: Self-pay | Admitting: Family Medicine

## 2023-12-10 NOTE — Telephone Encounter (Signed)
 Copied from CRM [id number]. Topic: Clinical - Lab/Test Results >> Dec 10, 2023  8:49 AM Precious C wrote: Reason for CRM: [facility] called to confirm that the patient's imaging results are available in the chart. Caller requested that the clinic and the patient's provider be notified.

## 2023-12-11 ENCOUNTER — Other Ambulatory Visit: Payer: Self-pay | Admitting: Family Medicine

## 2023-12-11 DIAGNOSIS — K8689 Other specified diseases of pancreas: Secondary | ICD-10-CM

## 2023-12-12 ENCOUNTER — Other Ambulatory Visit: Payer: Self-pay | Admitting: Family Medicine

## 2023-12-12 DIAGNOSIS — K8689 Other specified diseases of pancreas: Secondary | ICD-10-CM

## 2023-12-17 DIAGNOSIS — K869 Disease of pancreas, unspecified: Secondary | ICD-10-CM | POA: Diagnosis not present

## 2023-12-17 DIAGNOSIS — K625 Hemorrhage of anus and rectum: Secondary | ICD-10-CM | POA: Diagnosis not present

## 2023-12-17 DIAGNOSIS — R1032 Left lower quadrant pain: Secondary | ICD-10-CM | POA: Diagnosis not present

## 2023-12-17 DIAGNOSIS — K5900 Constipation, unspecified: Secondary | ICD-10-CM | POA: Diagnosis not present

## 2023-12-20 DIAGNOSIS — G4733 Obstructive sleep apnea (adult) (pediatric): Secondary | ICD-10-CM | POA: Diagnosis not present

## 2023-12-20 DIAGNOSIS — Z885 Allergy status to narcotic agent status: Secondary | ICD-10-CM | POA: Diagnosis not present

## 2023-12-20 DIAGNOSIS — Z7982 Long term (current) use of aspirin: Secondary | ICD-10-CM | POA: Diagnosis not present

## 2023-12-20 DIAGNOSIS — I351 Nonrheumatic aortic (valve) insufficiency: Secondary | ICD-10-CM | POA: Diagnosis not present

## 2023-12-20 DIAGNOSIS — K8689 Other specified diseases of pancreas: Secondary | ICD-10-CM | POA: Diagnosis not present

## 2023-12-20 DIAGNOSIS — I129 Hypertensive chronic kidney disease with stage 1 through stage 4 chronic kidney disease, or unspecified chronic kidney disease: Secondary | ICD-10-CM | POA: Diagnosis not present

## 2023-12-20 DIAGNOSIS — Z79899 Other long term (current) drug therapy: Secondary | ICD-10-CM | POA: Diagnosis not present

## 2023-12-20 DIAGNOSIS — D136 Benign neoplasm of pancreas: Secondary | ICD-10-CM | POA: Diagnosis not present

## 2023-12-20 DIAGNOSIS — F17210 Nicotine dependence, cigarettes, uncomplicated: Secondary | ICD-10-CM | POA: Diagnosis not present

## 2023-12-20 DIAGNOSIS — K625 Hemorrhage of anus and rectum: Secondary | ICD-10-CM | POA: Diagnosis not present

## 2023-12-20 DIAGNOSIS — Z91048 Other nonmedicinal substance allergy status: Secondary | ICD-10-CM | POA: Diagnosis not present

## 2023-12-20 DIAGNOSIS — I471 Supraventricular tachycardia, unspecified: Secondary | ICD-10-CM | POA: Diagnosis not present

## 2023-12-20 DIAGNOSIS — K648 Other hemorrhoids: Secondary | ICD-10-CM | POA: Diagnosis not present

## 2023-12-20 DIAGNOSIS — K5730 Diverticulosis of large intestine without perforation or abscess without bleeding: Secondary | ICD-10-CM | POA: Diagnosis not present

## 2023-12-20 DIAGNOSIS — Z860101 Personal history of adenomatous and serrated colon polyps: Secondary | ICD-10-CM | POA: Diagnosis not present

## 2023-12-20 DIAGNOSIS — Z853 Personal history of malignant neoplasm of breast: Secondary | ICD-10-CM | POA: Diagnosis not present

## 2023-12-20 DIAGNOSIS — E782 Mixed hyperlipidemia: Secondary | ICD-10-CM | POA: Diagnosis not present

## 2023-12-20 DIAGNOSIS — Z91040 Latex allergy status: Secondary | ICD-10-CM | POA: Diagnosis not present

## 2023-12-20 DIAGNOSIS — N1831 Chronic kidney disease, stage 3a: Secondary | ICD-10-CM | POA: Diagnosis not present

## 2023-12-20 DIAGNOSIS — I2510 Atherosclerotic heart disease of native coronary artery without angina pectoris: Secondary | ICD-10-CM | POA: Diagnosis not present

## 2023-12-24 ENCOUNTER — Ambulatory Visit (INDEPENDENT_AMBULATORY_CARE_PROVIDER_SITE_OTHER)

## 2023-12-24 ENCOUNTER — Other Ambulatory Visit: Payer: Self-pay | Admitting: Family Medicine

## 2023-12-24 DIAGNOSIS — K5730 Diverticulosis of large intestine without perforation or abscess without bleeding: Secondary | ICD-10-CM | POA: Diagnosis not present

## 2023-12-24 DIAGNOSIS — I7143 Infrarenal abdominal aortic aneurysm, without rupture: Secondary | ICD-10-CM | POA: Diagnosis not present

## 2023-12-24 DIAGNOSIS — K8689 Other specified diseases of pancreas: Secondary | ICD-10-CM

## 2023-12-24 DIAGNOSIS — K8020 Calculus of gallbladder without cholecystitis without obstruction: Secondary | ICD-10-CM | POA: Diagnosis not present

## 2023-12-24 DIAGNOSIS — K7689 Other specified diseases of liver: Secondary | ICD-10-CM | POA: Diagnosis not present

## 2023-12-24 MED ORDER — GADOBUTROL 1 MMOL/ML IV SOLN
7.8000 mL | Freq: Once | INTRAVENOUS | Status: AC | PRN
  Administered 2023-12-24: 7.8 mL via INTRAVENOUS

## 2023-12-31 ENCOUNTER — Telehealth: Payer: Self-pay

## 2023-12-31 ENCOUNTER — Telehealth: Payer: Self-pay | Admitting: Neurology

## 2023-12-31 NOTE — Telephone Encounter (Signed)
 Copied from CRM #[id number]. Topic: Clinical - Lab/Test Results >> Dec 31, 2023 11:48 AM [name] wrote: Reason for CRM: Patient is req a call back from nurse in regards to test results.

## 2023-12-31 NOTE — Telephone Encounter (Signed)
 MRI resulted but not yet reviewed by provider

## 2023-12-31 NOTE — Telephone Encounter (Signed)
 Patient [name]  is defective and she will not be able to use it.  She did speak to the company was advised that they can send her a new one for the one that didn't;t work well and for the rest of 2025.   Patient was advised her [name]  will not be covered after December 2025 anymore by her Insurance. So patient wanted to know should she go ahead and just stop the [name]  since it will no longer be covered.    Advised patient she should continue to take it until we can find something else for her to be covered by her insurance. This will help her have something on hand in case it take a while to find her what is covered and she can afford. Once Letter is received Dr,.Skeet will review and go from there.

## 2023-12-31 NOTE — Telephone Encounter (Signed)
 Pt called and stated that that her current insurance will not pay for the prescription called: Aimovig . Pt wants to know what should she do. The last Aimovig  injection did not have a needle in it. Thanks

## 2023-12-31 NOTE — Telephone Encounter (Signed)
 Patient advised of Dr.Jaffe note, I would still continue taking it through the end of the year while we figure out what we can switch it to. I know she has had trouble with her insurance covering the medications even though they might approve it. Can she contact her insurance to find out which medications will be formularly and what their copay may be for next year? Emgality, Qulipta  (maybe coverage has changed?), Ajovy , Vyepti, Nurtec EVERY OTHER DAY.

## 2024-01-01 ENCOUNTER — Ambulatory Visit: Payer: Self-pay | Admitting: Family Medicine

## 2024-01-02 ENCOUNTER — Other Ambulatory Visit: Payer: Self-pay

## 2024-01-02 NOTE — Telephone Encounter (Signed)
 Contacted [facility] to schedule biopsy results and pancreatic lesion follow-up appt. No additional procedures required at this time. Patient can see PA for follow-up.   Appt scheduled for 01/18/24 @ 1245pm @ [facility].   LVM for patient with information. Pt also wait listed for any sooner openings with Doctor.

## 2024-01-04 ENCOUNTER — Ambulatory Visit: Payer: Self-pay

## 2024-01-04 ENCOUNTER — Institutional Professional Consult (permissible substitution): Payer: Medicare Other | Admitting: Psychology

## 2024-01-11 ENCOUNTER — Encounter: Payer: Medicare Other | Admitting: Psychology

## 2024-01-17 MED ORDER — AIMOVIG 140 MG/ML ~~LOC~~ SOAJ: 140.0000 mg | SUBCUTANEOUS | 11 refills | Status: DC

## 2024-02-04 ENCOUNTER — Other Ambulatory Visit: Payer: Self-pay | Admitting: Cardiology

## 2024-02-15 ENCOUNTER — Other Ambulatory Visit: Payer: Self-pay | Admitting: Family Medicine

## 2024-02-15 DIAGNOSIS — Z1231 Encounter for screening mammogram for malignant neoplasm of breast: Secondary | ICD-10-CM

## 2024-02-18 ENCOUNTER — Inpatient Hospital Stay (HOSPITAL_BASED_OUTPATIENT_CLINIC_OR_DEPARTMENT_OTHER): Admission: RE | Admit: 2024-02-18 | Discharge: 2024-02-18 | Attending: Family Medicine | Admitting: Family Medicine

## 2024-02-18 ENCOUNTER — Encounter (HOSPITAL_BASED_OUTPATIENT_CLINIC_OR_DEPARTMENT_OTHER): Payer: Self-pay

## 2024-02-18 DIAGNOSIS — Z1231 Encounter for screening mammogram for malignant neoplasm of breast: Secondary | ICD-10-CM

## 2024-03-10 ENCOUNTER — Ambulatory Visit: Admitting: Family Medicine

## 2024-03-10 VITALS — BP 97/62 | HR 69 | Ht 65.0 in | Wt 170.0 lb

## 2024-03-10 DIAGNOSIS — M2550 Pain in unspecified joint: Secondary | ICD-10-CM | POA: Diagnosis not present

## 2024-03-10 DIAGNOSIS — F17200 Nicotine dependence, unspecified, uncomplicated: Secondary | ICD-10-CM | POA: Diagnosis not present

## 2024-03-10 DIAGNOSIS — I10 Essential (primary) hypertension: Secondary | ICD-10-CM

## 2024-03-10 DIAGNOSIS — I25118 Atherosclerotic heart disease of native coronary artery with other forms of angina pectoris: Secondary | ICD-10-CM | POA: Diagnosis not present

## 2024-03-10 MED ORDER — MELOXICAM 15 MG PO TABS: 15.0000 mg | ORAL_TABLET | Freq: Every day | ORAL | 3 refills | Status: AC | PRN

## 2024-03-10 NOTE — Patient Instructions (Signed)
 Try adding meloxicam daily as needed for pain.  Let me know if this is not helping or if you have side effects.    Upper Respiratory Infection, Adult An upper respiratory infection (URI) affects the nose, throat, and upper airways that lead to the lungs. The most common type of URI is often called the common cold. URIs usually get better on their own, without medical treatment. What are the causes? A URI is caused by a germ (virus). You may catch these germs by: Breathing in droplets from an infected person's cough or sneeze. Touching something that has the germ on it (is contaminated) and then touching your mouth, nose, or eyes. What increases the risk? You are more likely to get a URI if: You are very young or very old. You have close contact with others, such as at work, school, or a health care facility. You smoke. You have long-term (chronic) heart or lung disease. You have a weakened disease-fighting system (immune system). You have nasal allergies or asthma. You have a lot of stress. You have poor nutrition. What are the signs or symptoms? Runny or stuffy (congested) nose. Cough. Sneezing. Sore throat. Headache. Feeling tired (fatigue). Fever. Not wanting to eat as much as usual. Pain in your forehead, behind your eyes, and over your cheekbones (sinus pain). Muscle aches. Redness or irritation of the eyes. Pressure in the ears or face. How is this treated? URIs usually get better on their own within 7-10 days. Medicines cannot cure URIs, but your doctor may recommend certain medicines to help relieve symptoms, such as: Over-the-counter cold medicines. Medicines to reduce coughing (cough suppressants). Coughing is a type of defense against infection that helps to clear the nose, throat, windpipe, and lungs (respiratory system). Take these medicines only as told by your doctor. Medicines to lower your fever. Follow these instructions at home: Activity Rest as needed. If  you have a fever, stay home from work or school until your fever is gone, or until your doctor says you may return to work or school. You should stay home until you cannot spread the infection anymore (you are not contagious). Your doctor may have you wear a face mask so you have less risk of spreading the infection. Relieving symptoms Rinse your mouth often with salt water. To make salt water, dissolve -1 tsp (3-6 g) of salt in 1 cup (237 mL) of warm water. Use a cool-mist humidifier to add moisture to the air. This can help you breathe more easily. Eating and drinking  Drink enough fluid to keep your pee (urine) pale yellow. Eat soups and other clear broths. General instructions  Take over-the-counter and prescription medicines only as told by your doctor. Do not smoke or use any products that contain nicotine or tobacco. If you need help quitting, ask your doctor. Avoid being where people are smoking (avoid secondhand smoke). Stay up to date on all your shots (immunizations), and get the flu shot every year. Keep all follow-up visits. How to prevent the spread of infection to others  Wash your hands with soap and water for at least 20 seconds. If you cannot use soap and water, use hand sanitizer. Avoid touching your mouth, face, eyes, or nose. Cough or sneeze into a tissue or your sleeve or elbow. Do not cough or sneeze into your hand or into the air. Contact a doctor if: You are getting worse, not better. You have any of these: A fever or chills. Brown or red mucus in  your nose. Yellow or brown fluid (discharge)coming from your nose. Pain in your face, especially when you bend forward. Swollen neck glands. Pain when you swallow. White areas in the back of your throat. Get help right away if: You have shortness of breath that gets worse. You have very bad or constant: Headache. Ear pain. Pain in your forehead, behind your eyes, and over your cheekbones (sinus pain). Chest  pain. You have long-lasting (chronic) lung disease along with any of these: Making high-pitched whistling sounds when you breathe, most often when you breathe out (wheezing). Long-lasting cough (more than 14 days). Coughing up blood. A change in your usual mucus. You have a stiff neck. You have changes in your: Vision. Hearing. Thinking. Mood. These symptoms may be an emergency. Get help right away. Call 911. Do not wait to see if the symptoms will go away. Do not drive yourself to the hospital. Summary An upper respiratory infection (URI) is caused by a germ (virus). The most common type of URI is often called the common cold. URIs usually get better within 7-10 days. Take over-the-counter and prescription medicines only as told by your doctor. This information is not intended to replace advice given to you by your health care provider. Make sure you discuss any questions you have with your health care provider. Document Revised: 09/29/2020 Document Reviewed: 09/29/2020 Elsevier Patient Education  2024 [employer].
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
## 2024-03-16 ENCOUNTER — Encounter: Payer: Self-pay | Admitting: Family Medicine

## 2024-03-16 DIAGNOSIS — M2550 Pain in unspecified joint: Secondary | ICD-10-CM | POA: Insufficient documentation

## 2024-03-16 NOTE — Assessment & Plan Note (Signed)
 Counseled on cutting back on smoking.  Discussed that smoking can cause more inflammation and joint pain.

## 2024-03-16 NOTE — Assessment & Plan Note (Signed)
 Management per cardiology.  Discussed need to quit smoking.SABRA  She will plan to continue metoprolol  as well as aspirin  and statin.  Addition of Wegovy  was denied by insurance

## 2024-03-16 NOTE — Assessment & Plan Note (Signed)
Blood pressure is well-controlled at this time.  She will continue current medications.

## 2024-03-16 NOTE — Assessment & Plan Note (Signed)
 Adding trial of meloxicam .

## 2024-05-27 ENCOUNTER — Ambulatory Visit: Admitting: Neurology

## 2024-09-08 ENCOUNTER — Ambulatory Visit: Admitting: Family Medicine
